# Patient Record
Sex: Female | Born: 1970 | Race: White | Hispanic: No | Marital: Single | Smoking: Never smoker
Health system: Southern US, Community
[De-identification: ages and names within clinical notes are randomized; demographics above are authoritative.]

## PROBLEM LIST (undated history)

## (undated) DIAGNOSIS — E7800 Pure hypercholesterolemia, unspecified: Secondary | ICD-10-CM

## (undated) DIAGNOSIS — I10 Essential (primary) hypertension: Secondary | ICD-10-CM

## (undated) DIAGNOSIS — E119 Type 2 diabetes mellitus without complications: Secondary | ICD-10-CM

## (undated) HISTORY — PX: BUNIONECTOMY: SHX129

## (undated) HISTORY — PX: VARICOSE VEIN SURGERY: SHX832

---

## 2017-09-24 ENCOUNTER — Emergency Department (HOSPITAL_COMMUNITY)
Admission: EM | Admit: 2017-09-24 | Discharge: 2017-09-24 | Disposition: A | Discharge status: Home / Self Care | Payer: BLUE CROSS/BLUE SHIELD | Attending: Emergency Medicine | Admitting: Emergency Medicine

## 2017-09-24 ENCOUNTER — Emergency Department (HOSPITAL_COMMUNITY): Payer: BLUE CROSS/BLUE SHIELD

## 2017-09-24 ENCOUNTER — Encounter (HOSPITAL_COMMUNITY): Payer: Self-pay

## 2017-09-24 DIAGNOSIS — F430 Acute stress reaction: Secondary | ICD-10-CM | POA: Diagnosis not present

## 2017-09-24 DIAGNOSIS — R419 Unspecified symptoms and signs involving cognitive functions and awareness: Secondary | ICD-10-CM | POA: Diagnosis not present

## 2017-09-24 DIAGNOSIS — Z7984 Long term (current) use of oral hypoglycemic drugs: Secondary | ICD-10-CM | POA: Insufficient documentation

## 2017-09-24 DIAGNOSIS — R0789 Other chest pain: Secondary | ICD-10-CM | POA: Diagnosis not present

## 2017-09-24 DIAGNOSIS — I10 Essential (primary) hypertension: Secondary | ICD-10-CM

## 2017-09-24 DIAGNOSIS — F419 Anxiety disorder, unspecified: Secondary | ICD-10-CM

## 2017-09-24 DIAGNOSIS — Z79899 Other long term (current) drug therapy: Secondary | ICD-10-CM | POA: Diagnosis not present

## 2017-09-24 DIAGNOSIS — R202 Paresthesia of skin: Secondary | ICD-10-CM | POA: Diagnosis present

## 2017-09-24 DIAGNOSIS — E119 Type 2 diabetes mellitus without complications: Secondary | ICD-10-CM | POA: Insufficient documentation

## 2017-09-24 DIAGNOSIS — F439 Reaction to severe stress, unspecified: Secondary | ICD-10-CM

## 2017-09-24 DIAGNOSIS — R079 Chest pain, unspecified: Secondary | ICD-10-CM

## 2017-09-24 HISTORY — DX: Type 2 diabetes mellitus without complications: E11.9

## 2017-09-24 HISTORY — DX: Essential (primary) hypertension: I10

## 2017-09-24 LAB — I-STAT BETA HCG BLOOD, ED (MC, WL, AP ONLY): I-stat hCG, quantitative: <5 m[IU]/mL (ref <5)

## 2017-09-24 LAB — BASIC METABOLIC PANEL
ANION GAP: 10 (ref 5–15)
BUN: 14 mg/dL (ref 6–20)
CHLORIDE: 107 mmol/L (ref 101–111)
CO2: 21 mmol/L — AB (ref 22–32)
Calcium: 8.6 mg/dL — ABNORMAL LOW (ref 8.9–10.3)
Creatinine, Ser: 0.53 mg/dL (ref 0.44–1.00)
GFR calc non Af Amer: >60 mL/min (ref >60)
Glucose, Bld: 147 mg/dL — ABNORMAL HIGH (ref 65–99)
POTASSIUM: 3.8 mmol/L (ref 3.5–5.1)
Sodium: 138 mmol/L (ref 135–145)

## 2017-09-24 LAB — CBC
HEMATOCRIT: 39 % (ref 36.0–46.0)
Hemoglobin: 12.7 g/dL (ref 12.0–15.0)
MCH: 29.5 pg (ref 26.0–34.0)
MCHC: 32.6 g/dL (ref 30.0–36.0)
MCV: 90.7 fL (ref 78.0–100.0)
Platelets: 253 10*3/uL (ref 150–400)
RBC: 4.3 MIL/uL (ref 3.87–5.11)
RDW: 12.8 % (ref 11.5–15.5)
WBC: 7.5 10*3/uL (ref 4.0–10.5)

## 2017-09-24 LAB — I-STAT TROPONIN, ED: TROPONIN I, POC: 0 ng/mL (ref 0.00–0.08)

## 2017-09-24 MED ORDER — LISINOPRIL 5 MG PO TABS: 10.0000 mg | ORAL_TABLET | Freq: Every evening | ORAL | 1 refills | Status: DC

## 2017-09-24 MED ORDER — LORAZEPAM 0.5 MG PO TABS: 1.0000 mg | ORAL_TABLET | Freq: Four times a day (QID) | ORAL | 0 refills | Status: DC | PRN

## 2017-09-24 NOTE — ED Triage Notes (Signed)
Pt complains of pressure like chest pain for two days, she also complains that her right arm and hand is numb and her lips are numb

## 2017-09-24 NOTE — ED Notes (Signed)
Bed: WTR5 Expected date:  Expected time:  Means of arrival:  Comments: 

## 2017-09-24 NOTE — Discharge Instructions (Addendum)
Your blood pressure is elevated today, and may explain some of your discomfort.  We are increasing your blood pressure medicine.  Make sure that you continue to watch your salt intake and drink plenty of water to keep your blood pressure better.  Work on stress management to improve your discomfort.  We are providing a short-term prescription for help with anxiety, but it is better to not take this if you can avoid it.  Consider seeing a counselor, or therapist to help with stress management.  Follow-up with your doctor for further care and treatment as soon as possible.

## 2017-09-24 NOTE — ED Provider Notes (Signed)
[facility]-EMERGENCY DEPT Provider Note   CSN: [id number] Arrival date & time: 09/24/17  0458     History   Chief Complaint No chief complaint on file.   HPI Jody Ball is a 47 y.o. female.  HPI   She presents for evaluation of chest discomfort associated with tingling in the face and both hands.  She also has a numb sensation around her lips.  Sometimes when she takes a deep breath she has worsening of her chest discomfort.  She is taking her regular medicines as directed. Patient and husband who is with her reports that she is having lots of stress."  Patient is currently employed.  She states she is taking her medicines regularly.  There are no other known modifying factors  Past Medical History:  Diagnosis Date  . Diabetes mellitus without complication (HCC)   . Hypertension     There are no active problems to display for this patient.   History reviewed. No pertinent surgical history.   OB History   None      Home Medications    Prior to Admission medications   Medication Sig Start Date End Date Taking? Authorizing Provider  atorvastatin (LIPITOR) 20 MG tablet Take 20 mg by mouth daily. 09/07/17  Yes [provider]  fluticasone (FLONASE) 50 MCG/ACT nasal spray Place 2 sprays into both nostrils 2 (two) times daily as needed for allergies or rhinitis.  09/10/17  Yes [provider]  JARDIANCE 10 MG TABS tablet Take 10 mg by mouth daily. 09/10/17  Yes [provider]  metFORMIN (GLUCOPHAGE-XR) 500 MG 24 hr tablet Take 500 mg by mouth 2 (two) times daily. 09/20/17  Yes [provider]  SPRINTEC 28 0.25-35 MG-MCG tablet Take 1 tablet by mouth daily. 09/10/17  Yes [provider]  lisinopril (PRINIVIL,ZESTRIL) 5 MG tablet Take 2 tablets (10 mg total) by mouth every evening. 09/24/17   Mancel Bale, MD  LORazepam (ATIVAN) 0.5 MG tablet Take 2 tablets (1 mg total) by mouth every 6 (six) hours as needed for  anxiety. 09/24/17   Mancel Bale, MD    Family History History reviewed. No pertinent family history.  Social History Social History   Tobacco Use  . Smoking status: Never Smoker  . Smokeless tobacco: Never Used  Substance Use Topics  . Alcohol use: Never    Frequency: Never  . Drug use: Never     Allergies   Patient has no known allergies.   Review of Systems Review of Systems  All other systems reviewed and are negative.    Physical Exam Updated Vital Signs BP (!) 157/94 (BP Location: Left Arm)   Pulse 70   Temp 97.6 F (36.4 C) (Oral)   Resp 15   LMP 09/09/2017   SpO2 100%   Physical Exam  Constitutional: She is oriented to person, place, and time. She appears well-developed and well-nourished. No distress.  HENT:  Head: Normocephalic and atraumatic.  Eyes: Pupils are equal, round, and reactive to light. Conjunctivae and EOM are normal.  Neck: Normal range of motion and phonation normal. Neck supple.  Cardiovascular: Normal rate and regular rhythm.  Pulmonary/Chest: Effort normal and breath sounds normal. No respiratory distress. She exhibits no tenderness.  Abdominal: Soft. She exhibits no distension. There is no tenderness. There is no guarding.  Musculoskeletal: Normal range of motion.  Neurological: She is alert and oriented to person, place, and time. She exhibits normal muscle tone.  Skin: Skin is warm  and dry.  Psychiatric: Her behavior is normal. Judgment and thought content normal.  Mildly anxious  Nursing note and vitals reviewed.    ED Treatments / Results  Labs (all labs ordered are listed, but only abnormal results are displayed) Labs Reviewed  BASIC METABOLIC PANEL - Abnormal; Notable for the following components:      Result Value   CO2 21 (*)    Glucose, Bld 147 (*)    Calcium 8.6 (*)    All other components within normal limits  CBC  I-STAT TROPONIN, ED  I-STAT BETA HCG BLOOD, ED ([facility], [facility], [facility] ONLY)    EKG EKG  Interpretation  Date/Time:  Tuesday Sep 24 2017 05:05:22 EDT Ventricular Rate:  90 PR Interval:    QRS Duration: 88 QT Interval:  381 QTC Calculation: 467 R Axis:   91 Text Interpretation:  Sinus rhythm Right atrial enlargement Borderline right axis deviation Baseline wander in lead(s) V2 V6 No previous tracing Confirmed by Gilda Crease [phone]) on 09/24/2017 5:08:20 AM   Radiology Dg Chest 2 View  Result Date: 09/24/2017 CLINICAL DATA:  46 year old female with chest pain. EXAM: CHEST - 2 VIEW COMPARISON:  None. FINDINGS: The heart size and mediastinal contours are within normal limits. Both lungs are clear. The visualized skeletal structures are unremarkable. IMPRESSION: No active cardiopulmonary disease. Electronically Signed   By: Elgie Collard M.D.   On: 09/24/2017 06:36    Procedures Procedures (including critical care time)  Medications Ordered in ED Medications - No data to display   Initial Impression / Assessment and Plan / ED Course  I have reviewed the triage vital signs and the nursing notes.  Pertinent labs & imaging results that were available during my care of the patient were reviewed by me and considered in my medical decision making (see chart for details).  Clinical Course as of Sep 25 830  Tue Sep 24, 2017  0759 I-Stat beta hCG blood, ED [EW]  [id number] Normal  I-Stat beta hCG blood, ED [EW]  0803 I-stat troponin, ED [EW]  0803 normal   [EW]  0803 Normal except CO2 low, calcium low, and glucose high  Basic metabolic panel(!) [EW]  0804 Normal  CBC [EW]  0804 Elevated  BP(!): 171/98 [EW]    Clinical Course User Index [EW] Mancel Bale, MD     Patient Vitals for the past 24 hrs:  BP Temp Temp src Pulse Resp SpO2  09/24/17 0829 (!) 157/94 - - 70 15 100 %  09/24/17 0758 (!) 157/97 97.6 F (36.4 C) Oral 77 10 100 %  09/24/17 0506 (!) 171/98 98.1 F (36.7 C) Oral 88 20 100 %    8:30 AM Reevaluation with update and discussion. After  initial assessment and treatment, an updated evaluation reveals no change in clinical status.  Findings discussed with patient and husband, all questions answered. Mancel Bale   Medical decision making-nonspecific short-term chest pain with signs and symptoms of anxiety and mild hypertension.  Hypertensive urgency, ACS, PE or pneumonia.  Nursing Notes Reviewed/ Care Coordinated Applicable Imaging Reviewed Interpretation of Laboratory Data incorporated into ED treatment  The patient appears reasonably screened and/or stabilized for discharge and I doubt any other medical condition or other [facility] requiring further screening, evaluation, or treatment in the ED at this time prior to discharge.  Plan: Home Medications-increase lisinopril to 10 mg nightly.; Home Treatments-rest, fluids, stress management; return here if the recommended treatment, does not improve the symptoms; Recommended follow up-PCP checkup 1  week and as needed         Final Clinical Impressions(s) / ED Diagnoses   Final diagnoses:  Hypertension, unspecified type  Anxiety  Chest pain, unspecified type  Stress    ED Discharge Orders        Ordered    lisinopril (PRINIVIL,ZESTRIL) 5 MG tablet  Every evening     09/24/17 0821    LORazepam (ATIVAN) 0.5 MG tablet  Every 6 hours PRN     09/24/17 2440       Mancel Bale, MD 09/24/17 [phone]

## 2021-11-03 ENCOUNTER — Emergency Department (HOSPITAL_COMMUNITY): Payer: BC Managed Care – PPO

## 2021-11-03 ENCOUNTER — Encounter (HOSPITAL_COMMUNITY): Payer: Self-pay

## 2021-11-03 ENCOUNTER — Other Ambulatory Visit: Payer: Self-pay

## 2021-11-03 ENCOUNTER — Inpatient Hospital Stay (HOSPITAL_COMMUNITY)
Admission: EM | Admit: 2021-11-03 | Discharge: 2021-11-07 | DRG: 871 | Disposition: A | Discharge status: Home / Self Care | Payer: BC Managed Care – PPO | Attending: Internal Medicine | Admitting: Internal Medicine

## 2021-11-03 DIAGNOSIS — D649 Anemia, unspecified: Secondary | ICD-10-CM | POA: Diagnosis present

## 2021-11-03 DIAGNOSIS — E1165 Type 2 diabetes mellitus with hyperglycemia: Secondary | ICD-10-CM | POA: Diagnosis present

## 2021-11-03 DIAGNOSIS — E872 Acidosis, unspecified: Secondary | ICD-10-CM | POA: Diagnosis not present

## 2021-11-03 DIAGNOSIS — E1169 Type 2 diabetes mellitus with other specified complication: Secondary | ICD-10-CM | POA: Diagnosis present

## 2021-11-03 DIAGNOSIS — R6521 Severe sepsis with septic shock: Secondary | ICD-10-CM | POA: Diagnosis present

## 2021-11-03 DIAGNOSIS — K5900 Constipation, unspecified: Secondary | ICD-10-CM | POA: Diagnosis present

## 2021-11-03 DIAGNOSIS — E1110 Type 2 diabetes mellitus with ketoacidosis without coma: Secondary | ICD-10-CM | POA: Diagnosis present

## 2021-11-03 DIAGNOSIS — E871 Hypo-osmolality and hyponatremia: Secondary | ICD-10-CM | POA: Diagnosis present

## 2021-11-03 DIAGNOSIS — N179 Acute kidney failure, unspecified: Secondary | ICD-10-CM | POA: Diagnosis not present

## 2021-11-03 DIAGNOSIS — N12 Tubulo-interstitial nephritis, not specified as acute or chronic: Secondary | ICD-10-CM | POA: Diagnosis present

## 2021-11-03 DIAGNOSIS — A419 Sepsis, unspecified organism: Secondary | ICD-10-CM | POA: Diagnosis present

## 2021-11-03 DIAGNOSIS — E860 Dehydration: Secondary | ICD-10-CM | POA: Diagnosis present

## 2021-11-03 DIAGNOSIS — R319 Hematuria, unspecified: Secondary | ICD-10-CM | POA: Diagnosis present

## 2021-11-03 DIAGNOSIS — A4181 Sepsis due to Enterococcus: Secondary | ICD-10-CM | POA: Diagnosis present

## 2021-11-03 DIAGNOSIS — Z7984 Long term (current) use of oral hypoglycemic drugs: Secondary | ICD-10-CM | POA: Diagnosis not present

## 2021-11-03 DIAGNOSIS — I10 Essential (primary) hypertension: Secondary | ICD-10-CM | POA: Diagnosis present

## 2021-11-03 DIAGNOSIS — E782 Mixed hyperlipidemia: Secondary | ICD-10-CM | POA: Diagnosis present

## 2021-11-03 DIAGNOSIS — I21A1 Myocardial infarction type 2: Secondary | ICD-10-CM | POA: Diagnosis not present

## 2021-11-03 DIAGNOSIS — Z800 Family history of malignant neoplasm of digestive organs: Secondary | ICD-10-CM | POA: Diagnosis not present

## 2021-11-03 DIAGNOSIS — N170 Acute kidney failure with tubular necrosis: Secondary | ICD-10-CM | POA: Diagnosis present

## 2021-11-03 DIAGNOSIS — Z79899 Other long term (current) drug therapy: Secondary | ICD-10-CM | POA: Diagnosis not present

## 2021-11-03 DIAGNOSIS — N10 Acute tubulo-interstitial nephritis: Secondary | ICD-10-CM | POA: Diagnosis present

## 2021-11-03 DIAGNOSIS — E876 Hypokalemia: Secondary | ICD-10-CM | POA: Diagnosis not present

## 2021-11-03 DIAGNOSIS — N390 Urinary tract infection, site not specified: Secondary | ICD-10-CM | POA: Diagnosis not present

## 2021-11-03 HISTORY — DX: Pure hypercholesterolemia, unspecified: E78.00

## 2021-11-03 LAB — I-STAT BETA HCG BLOOD, ED (MC, WL, AP ONLY): I-stat hCG, quantitative: <5 m[IU]/mL (ref <5)

## 2021-11-03 LAB — URINALYSIS, ROUTINE W REFLEX MICROSCOPIC
Bilirubin Urine: NEGATIVE
Glucose, UA: 50 mg/dL — AB
Ketones, ur: 20 mg/dL — AB
Nitrite: NEGATIVE
Protein, ur: 100 mg/dL — AB
Specific Gravity, Urine: 1.023 (ref 1.005–1.030)
pH: 5 (ref 5.0–8.0)

## 2021-11-03 LAB — CBG MONITORING, ED: Glucose-Capillary: 317 mg/dL — ABNORMAL HIGH (ref 70–99)

## 2021-11-03 LAB — CBC
HCT: 37.9 % (ref 36.0–46.0)
Hemoglobin: 12.7 g/dL (ref 12.0–15.0)
MCH: 32.5 pg (ref 26.0–34.0)
MCHC: 33.5 g/dL (ref 30.0–36.0)
MCV: 96.9 fL (ref 80.0–100.0)
Platelets: 238 10*3/uL (ref 150–400)
RBC: 3.91 MIL/uL (ref 3.87–5.11)
RDW: 12.6 % (ref 11.5–15.5)
WBC: 9.6 10*3/uL (ref 4.0–10.5)
nRBC: 0 % (ref 0.0–0.2)

## 2021-11-03 LAB — BLOOD GAS, VENOUS
Acid-base deficit: 25 mmol/L — ABNORMAL HIGH (ref 0.0–2.0)
Bicarbonate: 4 mmol/L — ABNORMAL LOW (ref 20.0–28.0)
O2 Saturation: 88.4 %
Patient temperature: 36.6
pCO2, Ven: <18 mmHg — CL (ref 44–60)
pH, Ven: 7.03 — CL (ref 7.25–7.43)
pO2, Ven: 62 mmHg — ABNORMAL HIGH (ref 32–45)

## 2021-11-03 LAB — GLUCOSE, CAPILLARY: Glucose-Capillary: 328 mg/dL — ABNORMAL HIGH (ref 70–99)

## 2021-11-03 LAB — COMPREHENSIVE METABOLIC PANEL
ALT: 25 U/L (ref 0–44)
AST: 30 U/L (ref 15–41)
Albumin: 3 g/dL — ABNORMAL LOW (ref 3.5–5.0)
Alkaline Phosphatase: 66 U/L (ref 38–126)
Anion gap: >20 — ABNORMAL HIGH (ref 5–15)
BUN: 72 mg/dL — ABNORMAL HIGH (ref 6–20)
CO2: 11 mmol/L — ABNORMAL LOW (ref 22–32)
Calcium: 8.8 mg/dL — ABNORMAL LOW (ref 8.9–10.3)
Chloride: 90 mmol/L — ABNORMAL LOW (ref 98–111)
Creatinine, Ser: 4.82 mg/dL — ABNORMAL HIGH (ref 0.44–1.00)
GFR, Estimated: 10 mL/min — ABNORMAL LOW (ref >60)
Glucose, Bld: 321 mg/dL — ABNORMAL HIGH (ref 70–99)
Potassium: 4.1 mmol/L (ref 3.5–5.1)
Sodium: 128 mmol/L — ABNORMAL LOW (ref 135–145)
Total Bilirubin: 0.5 mg/dL (ref 0.3–1.2)
Total Protein: 7 g/dL (ref 6.5–8.1)

## 2021-11-03 LAB — LACTIC ACID, PLASMA
Lactic Acid, Venous: 5.2 mmol/L (ref 0.5–1.9)
Lactic Acid, Venous: 5.4 mmol/L (ref 0.5–1.9)
Lactic Acid, Venous: 7.1 mmol/L (ref 0.5–1.9)

## 2021-11-03 LAB — APTT: aPTT: 32 seconds (ref 24–36)

## 2021-11-03 LAB — BETA-HYDROXYBUTYRIC ACID: Beta-Hydroxybutyric Acid: >8 mmol/L — ABNORMAL HIGH (ref 0.05–0.27)

## 2021-11-03 LAB — PROTIME-INR
INR: 0.9 (ref 0.8–1.2)
Prothrombin Time: 12.5 seconds (ref 11.4–15.2)

## 2021-11-03 LAB — LIPASE, BLOOD: Lipase: 26 U/L (ref 11–51)

## 2021-11-03 MED ORDER — BUSPIRONE HCL 5 MG PO TABS
5.0000 mg | ORAL_TABLET | Freq: Two times a day (BID) | ORAL | Status: DC | PRN
  Administered 2021-11-03: 5 mg via ORAL
  Filled 2021-11-03: qty 1

## 2021-11-03 MED ORDER — MORPHINE SULFATE (PF) 4 MG/ML IV SOLN: 4.0000 mg | INTRAVENOUS | Status: DC | PRN

## 2021-11-03 MED ORDER — ATORVASTATIN CALCIUM 10 MG PO TABS: 20.0000 mg | ORAL_TABLET | Freq: Every day | ORAL | Status: DC

## 2021-11-03 MED ORDER — ENSURE ENLIVE PO LIQD
237.0000 mL | Freq: Two times a day (BID) | ORAL | Status: DC
  Administered 2021-11-04 – 2021-11-05 (×2): 237 mL via ORAL

## 2021-11-03 MED ORDER — ACETAMINOPHEN 325 MG PO TABS
650.0000 mg | ORAL_TABLET | Freq: Four times a day (QID) | ORAL | Status: DC | PRN
  Administered 2021-11-05 – 2021-11-07 (×4): 650 mg via ORAL
  Filled 2021-11-03 (×4): qty 2

## 2021-11-03 MED ORDER — LACTATED RINGERS IV BOLUS
1000.0000 mL | Freq: Once | INTRAVENOUS | Status: AC
  Administered 2021-11-03: 1000 mL via INTRAVENOUS

## 2021-11-03 MED ORDER — ACETAMINOPHEN 650 MG RE SUPP: 650.0000 mg | Freq: Four times a day (QID) | RECTAL | Status: DC | PRN

## 2021-11-03 MED ORDER — LACTATED RINGERS IV BOLUS
1000.0000 mL | Freq: Once | INTRAVENOUS | Status: AC
Start: 2021-11-03 — End: 2021-11-04
  Administered 2021-11-03: 1000 mL via INTRAVENOUS

## 2021-11-03 MED ORDER — DEXTROSE 50 % IV SOLN: 0.0000 mL | INTRAVENOUS | Status: DC | PRN

## 2021-11-03 MED ORDER — HEPARIN SODIUM (PORCINE) 5000 UNIT/ML IJ SOLN
5000.0000 [IU] | Freq: Three times a day (TID) | INTRAMUSCULAR | Status: DC
  Administered 2021-11-03 – 2021-11-04 (×2): 5000 [IU] via SUBCUTANEOUS
  Filled 2021-11-03 (×3): qty 1

## 2021-11-03 MED ORDER — SODIUM CHLORIDE 0.9% FLUSH
3.0000 mL | Freq: Two times a day (BID) | INTRAVENOUS | Status: DC
  Administered 2021-11-03 – 2021-11-07 (×8): 3 mL via INTRAVENOUS

## 2021-11-03 MED ORDER — SODIUM CHLORIDE 0.9 % IV SOLN
2.0000 g | INTRAVENOUS | Status: DC
  Administered 2021-11-03: 2 g via INTRAVENOUS
  Filled 2021-11-03: qty 20

## 2021-11-03 MED ORDER — LACTATED RINGERS IV SOLN: INTRAVENOUS | Status: DC

## 2021-11-03 MED ORDER — ONDANSETRON HCL 4 MG/2ML IJ SOLN: 4.0000 mg | Freq: Four times a day (QID) | INTRAMUSCULAR | Status: DC | PRN

## 2021-11-03 MED ORDER — MORPHINE SULFATE (PF) 2 MG/ML IV SOLN
2.0000 mg | Freq: Once | INTRAVENOUS | Status: AC
  Administered 2021-11-03: 2 mg via INTRAVENOUS
  Filled 2021-11-03: qty 1

## 2021-11-03 MED ORDER — POLYETHYLENE GLYCOL 3350 17 G PO PACK: 17.0000 g | PACK | Freq: Every day | ORAL | Status: DC | PRN

## 2021-11-03 MED ORDER — HYDROMORPHONE HCL 1 MG/ML IJ SOLN
1.0000 mg | INTRAMUSCULAR | Status: DC | PRN
  Administered 2021-11-03: 1 mg via INTRAVENOUS
  Filled 2021-11-03: qty 1

## 2021-11-03 MED ORDER — FENTANYL CITRATE PF 50 MCG/ML IJ SOSY
25.0000 ug | PREFILLED_SYRINGE | Freq: Once | INTRAMUSCULAR | Status: AC
  Administered 2021-11-03: 25 ug via INTRAVENOUS
  Filled 2021-11-03: qty 1

## 2021-11-03 MED ORDER — MORPHINE SULFATE (PF) 2 MG/ML IV SOLN
2.0000 mg | INTRAVENOUS | Status: DC | PRN
  Administered 2021-11-03: 2 mg via INTRAVENOUS
  Filled 2021-11-03: qty 1

## 2021-11-03 MED ORDER — HYDROMORPHONE HCL 1 MG/ML IJ SOLN: 0.5000 mg | INTRAMUSCULAR | Status: DC | PRN

## 2021-11-03 MED ORDER — HYDRALAZINE HCL 20 MG/ML IJ SOLN: 10.0000 mg | Freq: Four times a day (QID) | INTRAMUSCULAR | Status: DC | PRN

## 2021-11-03 MED ORDER — OXYCODONE-ACETAMINOPHEN 5-325 MG PO TABS: 1.0000 | ORAL_TABLET | ORAL | Status: DC | PRN

## 2021-11-03 MED ORDER — DEXTROSE IN LACTATED RINGERS 5 % IV SOLN: INTRAVENOUS | Status: DC

## 2021-11-03 MED ORDER — ONDANSETRON HCL 4 MG PO TABS: 4.0000 mg | ORAL_TABLET | Freq: Four times a day (QID) | ORAL | Status: DC | PRN

## 2021-11-03 MED ORDER — INSULIN REGULAR(HUMAN) IN NACL 100-0.9 UT/100ML-% IV SOLN
INTRAVENOUS | Status: DC
  Administered 2021-11-04: 4 [IU]/h via INTRAVENOUS
  Filled 2021-11-03: qty 100

## 2021-11-03 MED ORDER — PIPERACILLIN-TAZOBACTAM IN DEX 2-0.25 GM/50ML IV SOLN
2.2500 g | Freq: Three times a day (TID) | INTRAVENOUS | Status: DC
  Administered 2021-11-03 – 2021-11-05 (×5): 2.25 g via INTRAVENOUS
  Filled 2021-11-03 (×6): qty 50

## 2021-11-03 MED ORDER — INSULIN ASPART 100 UNIT/ML IJ SOLN
0.0000 [IU] | Freq: Three times a day (TID) | INTRAMUSCULAR | Status: DC
  Administered 2021-11-03: 11 [IU] via SUBCUTANEOUS

## 2021-11-03 NOTE — Sepsis Progress Note (Signed)
Following per sepsis protocol   

## 2021-11-03 NOTE — Assessment & Plan Note (Signed)
.   Holding statin therapy for now while patient is n.p.o.

## 2021-11-03 NOTE — Assessment & Plan Note (Signed)
   Modest hyponatremia likely secondary to volume depletion  Hydrating patient with intravenous isotonic fluids  Monitoring sodium levels with serial chemistries

## 2021-11-03 NOTE — Assessment & Plan Note (Signed)
.   Substantial hyperglycemia in the setting of sepsis . Patient exhibiting significant anion gap however this is most likely due to lactic acidosis.  Will obtain beta-hydroxybutyrate and urinalysis to evaluate for evidence of DKA albeit unlikely . Patient been placed on Accu-Cheks before every meal and nightly with sliding scale insulin . Holding home regimen of oral hypoglycemics . Hemoglobin A1C ordered . Diabetic Diet

## 2021-11-03 NOTE — Progress Notes (Signed)
Pt arrived to room via stretcher from ED. Pt oriented to room/floor. Demonstrated understanding. Pt placed on telemetry. Continue LR bolus. Denies any further needs at this time.

## 2021-11-03 NOTE — Sepsis Progress Note (Signed)
Notified MD requesting orders for serial lactic acids due to elevation of the repeat lab results.

## 2021-11-03 NOTE — Progress Notes (Signed)
Pharmacy Antibiotic Note  Tranisha Tissue is a 51 y.o. female admitted on 11/03/2021 with sepsis, AKI.  Pharmacy has been consulted for Zosyn dosing.  Plan: Zosyn 2.25g IV q8h  Follow up renal function, culture results, and clinical course.  Height: 5\' 5"  (165.1 cm) Weight: 66.7 kg (147 lb) IBW/kg (Calculated) : 57  Temp (24hrs), Avg:98.2 F (36.8 C), Min:97.8 F (36.6 C), Max:98.7 F (37.1 C)  Recent Labs  Lab 11/03/21 1631 11/03/21 1651 11/03/21 1800  WBC 9.6  --   --   CREATININE 4.82*  --   --   LATICACIDVEN  --  5.2* 5.4*    Estimated Creatinine Clearance: 12.3 mL/min (A) (by C-G formula based on SCr of 4.82 mg/dL (H)).    No Known Allergies  Antimicrobials this admission: PTA 6/15 Cipro >> 6/16 6/16 Ceftriaxone x1 6/16 Zosyn >>  Dose adjustments this admission:   Microbiology results: 6/16 BCx:  6/16 UCx:   Thank you for allowing pharmacy to be a part of this patient's care.  7/16 PharmD, BCPS Clinical Pharmacist [facility] main pharmacy [phone] 11/03/2021 9:08 PM

## 2021-11-03 NOTE — ED Triage Notes (Addendum)
Patient reports that she saw her PCP yesterday- Patient reports a UTI and received antibiotics, abnormal labs, CT scan was done, patient has not had a BM in 7 days. Patient also reports hypotension and fever.  [id number]

## 2021-11-03 NOTE — Assessment & Plan Note (Addendum)
.   Patient presenting with severe right-sided pyelonephritis in the setting of multiple SIRS criteria and evidence of organ dysfunction with acute kidney injury all concerning for sepsis with organ dysfunction . Throughout the evening despite nearly 5 L of isotonic fluids being administered patient is becoming progressively more hypotensive suggestive of septic shock . Likely gram-negative organism as the cause . Patient has been clinically worsening over the past 48 hours despite receiving a recent dose of intramuscular ceftriaxone. . Due to severity of illness patient was initially placed on Zosyn but through the evening the patient has clinically decompensated and therefore we are additionally adding vancomycin. Marland Kitchen Hydrating patient aggressively with intravenous isotonic fluids . Due to significant hemodynamic compromise despite aggressive fluids we will additionally add Levophed and ask our PCCM partners to come evaluate patient to potentially go to their service.   . Patient will likely need central line placement. . Blood cultures and urine cultures obtained, antibiotic therapy will be deescalated based on these results . Obtaining serial lactic acid levels . Close clinical monitoring as patient is at high risk of rapid clinical decompensation

## 2021-11-03 NOTE — ED Notes (Signed)
ED TO INPATIENT HANDOFF REPORT  Name/Age/Gender Jody Ball 51 y.o. female  Code Status   Home/SNF/Other Home  Chief Complaint Sepsis due to gram-negative UTI ([place]) [A41.50, N39.0]  Level of Care/Admitting Diagnosis ED Disposition     ED Disposition  Admit   Condition  --   Comment  Hospital Area: [place] [[id number]]  Level of Care: Progressive [102]  Admit to Progressive based on following criteria: MULTISYSTEM THREATS such as stable sepsis, metabolic/electrolyte imbalance with or without encephalopathy that is responding to early treatment.  May admit patient to [facility] or [facility] if equivalent level of care is available:: No  Covid Evaluation: Asymptomatic - no recent exposure (last 10 days) testing not required  Diagnosis: Sepsis due to gram-negative UTI [facility]) [id number]  Admitting Physician: Vernelle Emerald [id number]  Attending Physician: Vernelle Emerald [id number]  Estimated length of stay: 3 - 4 days  Certification:: I certify this patient will need inpatient services for at least 2 midnights          Medical History Past Medical History:  Diagnosis Date   Diabetes mellitus without complication ([place])    High cholesterol    Hypertension     Allergies No Known Allergies  IV Location/Drains/Wounds Patient Lines/Drains/Airways Status     Active Line/Drains/Airways     Name Placement date Placement time Site Days   Peripheral IV 11/03/21 20 G Left Antecubital 11/03/21  1751  Antecubital  less than 1   Peripheral IV 11/03/21 22 G Anterior;Right Hand 11/03/21  1950  Hand  less than 1   External Urinary Catheter 11/03/21  1854  --  less than 1            Labs/Imaging Results for orders placed or performed during the hospital encounter of 11/03/21 (from the past 48 hour(s))  CBG monitoring, ED     Status: Abnormal   Collection Time: 11/03/21  3:50 PM  Result Value Ref Range   Glucose-Capillary 317 (H) 70 - 99 mg/dL     Comment: Glucose reference range applies only to samples taken after fasting for at least 8 hours.  Lipase, blood     Status: None   Collection Time: 11/03/21  4:31 PM  Result Value Ref Range   Lipase 26 11 - 51 U/L    Comment: Performed at [facility], [place] [address], [place], [place] [address]  Comprehensive metabolic panel     Status: Abnormal   Collection Time: 11/03/21  4:31 PM  Result Value Ref Range   Sodium 128 (L) 135 - 145 mmol/L    Comment: REPEATED TO VERIFY   Potassium 4.1 3.5 - 5.1 mmol/L   Chloride 90 (L) 98 - 111 mmol/L    Comment: REPEATED TO VERIFY   CO2 11 (L) 22 - 32 mmol/L    Comment: REPEATED TO VERIFY   Glucose, Bld 321 (H) 70 - 99 mg/dL    Comment: Glucose reference range applies only to samples taken after fasting for at least 8 hours.   BUN 72 (H) 6 - 20 mg/dL   Creatinine, Ser 4.82 (H) 0.44 - 1.00 mg/dL   Calcium 8.8 (L) 8.9 - 10.3 mg/dL   Total Protein 7.0 6.5 - 8.1 g/dL   Albumin 3.0 (L) 3.5 - 5.0 g/dL   AST 30 15 - 41 U/L   ALT 25 0 - 44 U/L   Alkaline Phosphatase 66 38 - 126 U/L   Total Bilirubin 0.5 0.3 -  1.2 mg/dL   GFR, Estimated 10 (L) >60 mL/min    Comment: (NOTE) Calculated using the CKD-EPI Creatinine Equation (2021)    Anion gap >20 (H) 5 - 15    Comment: Performed at [facility], [address], [place], [place] [address]  CBC     Status: None   Collection Time: 11/03/21  4:31 PM  Result Value Ref Range   WBC 9.6 4.0 - 10.5 K/uL   RBC 3.91 3.87 - 5.11 MIL/uL   Hemoglobin 12.7 12.0 - 15.0 g/dL   HCT 84.6 96.2 - 95.2 %   MCV 96.9 80.0 - 100.0 fL   MCH 32.5 26.0 - 34.0 pg   MCHC 33.5 30.0 - 36.0 g/dL   RDW 84.1 32.4 - 40.1 %   Platelets 238 150 - 400 K/uL   nRBC 0.0 0.0 - 0.2 %    Comment: Performed at [facility], [address] [place], [place] [address]  I-Stat beta hCG blood, ED     Status: None   Collection Time: 11/03/21  4:35 PM  Result Value Ref Range    I-stat hCG, quantitative <5.0 <5 mIU/mL   Comment 3            Comment:   GEST. AGE      CONC.  (mIU/mL)   <=1 WEEK        5 - 50     2 WEEKS       50 - 500     3 WEEKS       100 - 10,000     4 WEEKS     1,000 - 30,000        FEMALE AND NON-PREGNANT FEMALE:     LESS THAN 5 mIU/mL   Lactic acid, plasma     Status: Abnormal   Collection Time: 11/03/21  4:51 PM  Result Value Ref Range   Lactic Acid, Venous 5.2 (HH) 0.5 - 1.9 mmol/L    Comment: CRITICAL RESULT CALLED TO, READ BACK BY AND VERIFIED WITH: CORTEX,I @1740  ON 11/03/21 BY LUZOLOP Performed at [facility], [address] [place], [place]   Protime-INR     Status: None   Collection Time: 11/03/21  4:51 PM  Result Value Ref Range   Prothrombin Time 12.5 11.4 - 15.2 seconds   INR 0.9 0.8 - 1.2    Comment: (NOTE) INR goal varies based on device and disease states. Performed at [facility], [address] [place], [place]   APTT     Status: None   Collection Time: 11/03/21  4:51 PM  Result Value Ref Range   aPTT 32 24 - 36 seconds    Comment: Performed at [facility], [address], [place], [place]  Lactic acid, plasma     Status: Abnormal   Collection Time: 11/03/21  6:00 PM  Result Value Ref Range   Lactic Acid, Venous 5.4 (HH) 0.5 - 1.9 mmol/L    Comment: CRITICAL RESULT CALLED TO, READ BACK BY AND VERIFIED WITH: Mesha Schamberger,J @1833  ON 11/03/21 BY LUZOLOP Performed at [facility], [address], [place], [place]    CT ABDOMEN PELVIS WO CONTRAST  Result Date: 11/03/2021 CLINICAL DATA:  Urinary tract infection. Fever. Hypotension. Change in bowel habits. EXAM: CT ABDOMEN AND PELVIS WITHOUT CONTRAST TECHNIQUE: Multidetector CT imaging of the abdomen and pelvis was performed following the standard protocol without IV  contrast. RADIATION DOSE REDUCTION: This exam was performed according to the departmental  dose-optimization program which includes automated exposure control, adjustment of the mA and/or kV according to patient size and/or use of iterative reconstruction technique. COMPARISON:  None Available. FINDINGS: Lower chest: No acute findings. Hepatobiliary: No mass visualized on this unenhanced exam. Contrast material is noted within the gallbladder which is otherwise unremarkable in appearance. No evidence of biliary ductal dilatation. Pancreas: No mass or inflammatory process visualized on this unenhanced exam. Spleen:  Within normal limits in size. Adrenals/Urinary tract: Right renal swelling is seen with numerous areas of decreased parenchymal enhancement and mild perinephric soft tissue stranding. These findings are consistent with severe pyelonephritis. No renal abscess identified. No evidence of ureteral calculi or hydronephrosis. Unremarkable urinary bladder. Stomach/Bowel: No evidence of obstruction, inflammatory process, or abnormal fluid collections. Vascular/Lymphatic: No pathologically enlarged lymph nodes identified. No evidence of abdominal aortic aneurysm. Reproductive:  No mass or other significant abnormality. Other:  None. Musculoskeletal:  No suspicious bone lesions identified. IMPRESSION: Severe right pyelonephritis. No evidence of renal abscess, ureteral calculus, or hydronephrosis. Electronically Signed   By: Danae Orleans M.D.   On: 11/03/2021 18:54   DG Chest Port 1 View  Result Date: 11/03/2021 CLINICAL DATA:  Possible sepsis EXAM: PORTABLE CHEST 1 VIEW COMPARISON:  09/24/2017 FINDINGS: The heart size and mediastinal contours are within normal limits. Both lungs are clear. The visualized skeletal structures are unremarkable. IMPRESSION: No active disease. Electronically Signed   By: Ernie Avena M.D.   On: 11/03/2021 16:51    Pending Labs Unresulted Labs (From admission, onward)     Start     Ordered   11/03/21 1628  Blood Culture (routine x 2)  (Undifferentiated  presentation (screening labs and basic nursing orders))  BLOOD CULTURE X 2,   STAT      11/03/21 1628   11/03/21 1628  Urine Culture  (Undifferentiated presentation (screening labs and basic nursing orders))  ONCE - URGENT,   URGENT       Question Answer Comment  Indication Flank Pain   Release to patient Immediate      11/03/21 1628   11/03/21 1603  Urinalysis, Routine w reflex microscopic  Once,   URGENT        11/03/21 1603            Vitals/Pain Today's Vitals   11/03/21 1803 11/03/21 1852 11/03/21 1900 11/03/21 1930  BP: 129/71 120/65 123/71 (!) 105/58  Pulse: (!) 112 (!) 113 (!) 115 (!) 120  Resp: 16 20 19 20   Temp:      TempSrc:      SpO2: 99% 98% 100% 100%  Weight:      Height:      PainSc:        Isolation Precautions No active isolations  Medications Medications  lactated ringers infusion ( Intravenous New Bag/Given 11/03/21 1949)  cefTRIAXone (ROCEPHIN) 2 g in sodium chloride 0.9 % 100 mL IVPB (2 g Intravenous New Bag/Given 11/03/21 1936)  lactated ringers bolus 1,000 mL (0 mLs Intravenous Stopped 11/03/21 1950)  fentaNYL (SUBLIMAZE) injection 25 mcg (25 mcg Intravenous Given 11/03/21 1809)  lactated ringers bolus 1,000 mL (0 mLs Intravenous Stopped 11/03/21 1950)  fentaNYL (SUBLIMAZE) injection 25 mcg (25 mcg Intravenous Given 11/03/21 1940)  lactated ringers bolus 1,000 mL (1,000 mLs Intravenous New Bag/Given 11/03/21 1941)    Mobility walks with person assist

## 2021-11-03 NOTE — ED Provider Notes (Signed)
[facility]-EMERGENCY DEPT Provider Note   CSN: [id number] Arrival date & time: 11/03/21  1544     History Chief Complaint  Patient presents with   Abnormal Lab   Hyperglycemia    Jody Ball is a 51 y.o. female patient who presents to the emergency department with low blood pressure, intermittent fever, and diagnosed pyelonephritis on CT imaging.  Patient started feeling ill on Sunday.  Patient was seen evaluated by her PCP on Tuesday she had blood work and had a CT scan that showed pyelonephritis.  Patient was given ceftriaxone.  She was instructed to come to the emergency department after she got some blood work back and her white count was still elevated and patient was not feeling better after ceftriaxone.  Fever at home has been up to 102.  She been taking Tylenol which she took this morning.  Patient complaining of abdominal pain that radiates to the back.   Abnormal Lab Hyperglycemia      Home Medications Prior to Admission medications   Medication Sig Start Date End Date Taking? Authorizing Provider  atorvastatin (LIPITOR) 20 MG tablet Take 20 mg by mouth daily. 09/07/17   [provider]  fluticasone (FLONASE) 50 MCG/ACT nasal spray Place 2 sprays into both nostrils 2 (two) times daily as needed for allergies or rhinitis.  09/10/17   [provider]  JARDIANCE 10 MG TABS tablet Take 10 mg by mouth daily. 09/10/17   [provider]  lisinopril (PRINIVIL,ZESTRIL) 5 MG tablet Take 2 tablets (10 mg total) by mouth every evening. 09/24/17   Mancel Bale, MD  LORazepam (ATIVAN) 0.5 MG tablet Take 2 tablets (1 mg total) by mouth every 6 (six) hours as needed for anxiety. 09/24/17   Mancel Bale, MD  metFORMIN (GLUCOPHAGE-XR) 500 MG 24 hr tablet Take 500 mg by mouth 2 (two) times daily. 09/20/17   [provider]  SPRINTEC 28 0.25-35 MG-MCG tablet Take 1 tablet by mouth daily. 09/10/17   [provider]      Allergies     Patient has no known allergies.    Review of Systems   Review of Systems  All other systems reviewed and are negative.   Physical Exam Updated Vital Signs BP (!) 105/58   Pulse (!) 120   Temp 98.7 F (37.1 C) (Oral)   Resp 20   Ht 5\' 5"  (1.651 m)   Wt 66.7 kg   LMP 10/19/2021 (Approximate)   SpO2 100%   BMI 24.46 kg/m  Physical Exam Vitals and nursing note reviewed.  Constitutional:      General: She is not in acute distress.    Appearance: Normal appearance.  HENT:     Head: Normocephalic and atraumatic.  Eyes:     General:        Right eye: No discharge.        Left eye: No discharge.  Cardiovascular:     Rate and Rhythm: Tachycardia present.     Comments: S1/S2 are distinct without any evidence of murmur, rubs, or gallops.  Radial pulses are 2+ bilaterally.  Dorsalis pedis pulses are 2+ bilaterally.  No evidence of pedal edema. Pulmonary:     Comments: Clear to auscultation bilaterally.  Normal effort.  No respiratory distress.  No evidence of wheezes, rales, or rhonchi heard throughout. Abdominal:     General: Abdomen is flat. Bowel sounds are normal. There is no distension.     Tenderness: There is no abdominal tenderness. There  is no guarding or rebound.  Musculoskeletal:        General: Normal range of motion.     Cervical back: Neck supple.  Skin:    General: Skin is warm and dry.     Findings: No rash.  Neurological:     General: No focal deficit present.     Mental Status: She is alert.  Psychiatric:        Mood and Affect: Mood normal.        Behavior: Behavior normal.     ED Results / Procedures / Treatments   Labs (all labs ordered are listed, but only abnormal results are displayed) Labs Reviewed  COMPREHENSIVE METABOLIC PANEL - Abnormal; Notable for the following components:      Result Value   Sodium 128 (*)    Chloride 90 (*)    CO2 11 (*)    Glucose, Bld 321 (*)    BUN 72 (*)    Creatinine, Ser 4.82 (*)    Calcium 8.8 (*)     Albumin 3.0 (*)    GFR, Estimated 10 (*)    Anion gap >20 (*)    All other components within normal limits  LACTIC ACID, PLASMA - Abnormal; Notable for the following components:   Lactic Acid, Venous 5.2 (*)    All other components within normal limits  LACTIC ACID, PLASMA - Abnormal; Notable for the following components:   Lactic Acid, Venous 5.4 (*)    All other components within normal limits  CBG MONITORING, ED - Abnormal; Notable for the following components:   Glucose-Capillary 317 (*)    All other components within normal limits  CULTURE, BLOOD (ROUTINE X 2)  CULTURE, BLOOD (ROUTINE X 2)  URINE CULTURE  LIPASE, BLOOD  CBC  PROTIME-INR  APTT  URINALYSIS, ROUTINE W REFLEX MICROSCOPIC  I-STAT BETA HCG BLOOD, ED ([facility], [facility], [facility] ONLY)    EKG EKG Interpretation  Date/Time:  Friday November 03 2021 17:03:36 EDT Ventricular Rate:  108 PR Interval:  175 QRS Duration: 88 QT Interval:  327 QTC Calculation: 439 R Axis:   94 Text Interpretation: Sinus tachycardia Borderline right axis deviation Low voltage, precordial leads ST elev, probable normal early repol pattern Confirmed by Coralee Pesa [phone]) on 11/03/2021 5:44:28 PM  Radiology CT ABDOMEN PELVIS WO CONTRAST  Result Date: 11/03/2021 CLINICAL DATA:  Urinary tract infection. Fever. Hypotension. Change in bowel habits. EXAM: CT ABDOMEN AND PELVIS WITHOUT CONTRAST TECHNIQUE: Multidetector CT imaging of the abdomen and pelvis was performed following the standard protocol without IV contrast. RADIATION DOSE REDUCTION: This exam was performed according to the departmental dose-optimization program which includes automated exposure control, adjustment of the mA and/or kV according to patient size and/or use of iterative reconstruction technique. COMPARISON:  None Available. FINDINGS: Lower chest: No acute findings. Hepatobiliary: No mass visualized on this unenhanced exam. Contrast material is noted within the gallbladder which is otherwise  unremarkable in appearance. No evidence of biliary ductal dilatation. Pancreas: No mass or inflammatory process visualized on this unenhanced exam. Spleen:  Within normal limits in size. Adrenals/Urinary tract: Right renal swelling is seen with numerous areas of decreased parenchymal enhancement and mild perinephric soft tissue stranding. These findings are consistent with severe pyelonephritis. No renal abscess identified. No evidence of ureteral calculi or hydronephrosis. Unremarkable urinary bladder. Stomach/Bowel: No evidence of obstruction, inflammatory process, or abnormal fluid collections. Vascular/Lymphatic: No pathologically enlarged lymph nodes identified. No evidence of abdominal aortic aneurysm. Reproductive:  No mass or other  significant abnormality. Other:  None. Musculoskeletal:  No suspicious bone lesions identified. IMPRESSION: Severe right pyelonephritis. No evidence of renal abscess, ureteral calculus, or hydronephrosis. Electronically Signed   By: Danae Orleans M.D.   On: 11/03/2021 18:54   DG Chest Port 1 View  Result Date: 11/03/2021 CLINICAL DATA:  Possible sepsis EXAM: PORTABLE CHEST 1 VIEW COMPARISON:  09/24/2017 FINDINGS: The heart size and mediastinal contours are within normal limits. Both lungs are clear. The visualized skeletal structures are unremarkable. IMPRESSION: No active disease. Electronically Signed   By: Ernie Avena M.D.   On: 11/03/2021 16:51    Procedures .Critical Care  Performed by: Teressa Lower, PA-C Authorized by: Teressa Lower, PA-C   Critical care provider statement:    Critical care time (minutes):  60   Critical care time was exclusive of:  Separately billable procedures and treating other patients   Critical care was necessary to treat or prevent imminent or life-threatening deterioration of the following conditions:  Sepsis and renal failure   Critical care was time spent personally by me on the following activities:  Ordering and  performing treatments and interventions, ordering and review of laboratory studies, pulse oximetry, ordering and review of radiographic studies, re-evaluation of patient's condition, blood draw for specimens, development of treatment plan with patient or surrogate and discussions with consultants     Medications Ordered in ED Medications  lactated ringers infusion (has no administration in time range)  cefTRIAXone (ROCEPHIN) 2 g in sodium chloride 0.9 % 100 mL IVPB (2 g Intravenous New Bag/Given 11/03/21 1936)  lactated ringers bolus 1,000 mL (1,000 mLs Intravenous New Bag/Given 11/03/21 1751)  fentaNYL (SUBLIMAZE) injection 25 mcg (25 mcg Intravenous Given 11/03/21 1809)  lactated ringers bolus 1,000 mL (1,000 mLs Intravenous New Bag/Given 11/03/21 1844)  fentaNYL (SUBLIMAZE) injection 25 mcg (25 mcg Intravenous Given 11/03/21 1940)  lactated ringers bolus 1,000 mL (1,000 mLs Intravenous New Bag/Given 11/03/21 1941)    ED Course/ Medical Decision Making/ A&P Clinical Course as of 11/03/21 1944  Fri Nov 03, 2021  1908 CBC No evidence of leukocytosis or anemia. [CF]  1909 Comprehensive metabolic panel(!) There is evidence of significant hyponatremia, elevated glucose, and significant elevated creatinine in comparison to baseline.  She had a normal creatinine back in February. [CF]  1911 Lipase, blood Lipase negative.  I below suspicion for pancreatitis.  [CF]  1912 Protime-INR PT/INR is normal. [CF]  1912 I-Stat beta hCG blood, ED Pregnancy negative. [CF]  1912 APTT Normal  [CF]  1912 Urinalysis, Routine w reflex microscopic Still pending.  [CF]  1912 CT ABDOMEN PELVIS WO CONTRAST I personally ordered interpreted CT abdomen pelvis without contrast concern the patient has significant elevated creatinine.  There is evidence of pyelonephritis on the right that is quite severe.  I do agree with radiologist interpretation. [CF]  1941 I spoke with Dr. Leafy Half with [facility] who agrees  to admit the patient.  [CF]    Clinical Course User Index [CF] Teressa Lower, PA-C                           Medical Decision Making Jody Ball is a 51 y.o. female patient who presents to the emergency department today for further evaluation of abdominal pain and back pain.  Old records were reviewed at the bedside as patient brought paperwork.  She did have a CT scan on Tuesday which did show evidence of pyelonephritis.  She was  given ceftriaxone.  Unclear if she received additional oral antibiotics.  Patient arrived hypotensive and tachycardic and was SIRS positive.  I suspect this is likely undertreated pyelonephritis.  Undifferentiated sepsis order set was initiated in addition to 2 L of lactic Ringer's.  Blood pressure improved.   Amount and/or Complexity of Data Reviewed External Data Reviewed: notes.    Details: Highlighted in HPI. Labs: ordered. Decision-making details documented in ED Course. Radiology: ordered and independent interpretation performed. Decision-making details documented in ED Course.  Risk Prescription drug management. Parenteral controlled substances. Decision regarding hospitalization. Risk Details: Given the clinical scenario, patient was started on UTI focused antibiotics for severe undertreated pyelonephritis and profound AKI.  I do feel that the patient would benefit from further evaluation in the hospital and that she does meet sepsis criteria.  We will work on getting her admitted to the hospital.  As of right now, patient is stable.   Final Clinical Impression(s) / ED Diagnoses Final diagnoses:  Sepsis with acute renal failure and septic shock, due to unspecified organism, unspecified acute renal failure type [facility])  Pyelonephritis    Rx / DC Orders ED Discharge Orders     None         Teressa Lower, PA-C 11/03/21 1944    Rozelle Logan, DO 11/03/21 2013

## 2021-11-03 NOTE — Assessment & Plan Note (Signed)
   Holding home regimen of oral antihypertensive therapy  As needed intravenous antihypertensives for markedly elevated blood pressure  Monitoring for any evidence of hypotension in the setting of sepsis

## 2021-11-03 NOTE — ED Provider Triage Note (Signed)
Emergency Medicine Provider Triage Evaluation Note  Jody Ball , a 51 y.o. female  was evaluated in triage.  Pt complains of abdominal pain. She states that same has been ongoing since Monday. She states that on Tuesday she went to her PCP and had labs drawn and a CT scan scheduled. She states that her urine showed signs of infection and she was given Rocephin for same. She had the CT scan Wednesday which showed acute pyelonephritis of the right kidney and ' no normal renal excretion of contrast from either kidney despite up to 9 minutes of delayed imaging compatible with acute and transient renal insufficiency.' Given these findings with patient's worsening pain she was sent here for further evaluation and management of same.  Patient also states that over the past few days she has been having fevers.  She states that this morning her temperature was 104 and she has been taking Tylenol for same with the last dose of Tylenol earlier this morning.  Patient states that she is still yet to have a bowel movement since Monday and has been nauseous without any vomiting.  States that her pain is in her lower abdomen and radiates into her bilateral flanks.  Review of Systems  Positive:  Negative: See above  Physical Exam  BP (!) 98/53 (BP Location: Right Arm)   Pulse (!) 115   Temp 98.7 F (37.1 C) (Oral)   Resp 18   Ht 5\' 5"  (1.651 m)   Wt 66.7 kg   LMP 10/19/2021 (Approximate)   SpO2 100%   BMI 24.46 kg/m  Gen:   Awake, no distress   Resp:  Normal effort  MSK:   Moves extremities without difficulty  Other:    Medical Decision Making  Medically screening exam initiated at 4:29 PM.  Appropriate orders placed.  Jody Ball was informed that the remainder of the evaluation will be completed by another provider, this initial triage assessment does not replace that evaluation, and the importance of remaining in the ED until their evaluation is complete.  Concern for sepsis given patient with known  pyelonephritis.  She is also hypotensive and tachycardic with fevers at home.  Therefore patient will require the next room available. Nursing staff notified of same.   Jody Sciara, PA-C 11/03/21 1702

## 2021-11-03 NOTE — Assessment & Plan Note (Signed)
   Profound metabolic acidosis that is likely multifactorial secondary to lactic acidosis and diabetic ketoacidosis simultaneously   Due to hemodynamic instability and worsening pH will provide patient with 1 amp of sodium bicarbonate push followed by an infusion of sodium bicarbonate for now until hemodynamics improved  Hydrating patient intravenous isotonic fluids while concurrently treating underlying infection  Monitoring serial lactic acid levels to ensure downtrending and resolution.  Managing concurrent DKA with insulin infusion

## 2021-11-03 NOTE — Assessment & Plan Note (Addendum)
.   Patient exhibiting evidence of acute kidney injury secondary to severe volume depletion, sepsis and pyelonephritis . Creatinine is currently 4.82 an increase compared to baseline of 0.53 . Hydrating patient with intravenous isotonic fluids. . Holding home regimen of hydrochlorothiazide and lisinopril. . Strict input and output monitoring . Monitoring renal function and electrolytes with serial chemistries . Avoiding nephrotoxic agents if at all possible . Obtaining urine electrolytes, CT imaging of the abdomen pelvis reveals no evidence of hydronephrosis or stone

## 2021-11-03 NOTE — ED Provider Notes (Signed)
50 year old female presents with abdominal/back pain.  Recent treatment of UTI/pyelonephritis.  SIRS positive on arrival.  Hypotensive, tachycardic, elevated lactic, no significant reduction with IV hydration.  Concern for sepsis/septic shock, plan for aggressive IV hydration and antibiotic.  Repeat CT scan done without due to renal failure confirms pyelonephritis findings.  Will admit.  .Critical Care  Performed by: Rozelle Logan, DO Authorized by: Rozelle Logan, DO   Critical care provider statement:    Critical care time (minutes):  30   Critical care time was exclusive of:  Separately billable procedures and treating other patients   Critical care was necessary to treat or prevent imminent or life-threatening deterioration of the following conditions:  Sepsis and shock   Critical care was time spent personally by me on the following activities:  Development of treatment plan with patient or surrogate, discussions with consultants, evaluation of patient's response to treatment, examination of patient, ordering and review of laboratory studies, ordering and review of radiographic studies, ordering and performing treatments and interventions, pulse oximetry, re-evaluation of patient's condition and review of old charts   Care discussed with: admitting provider       Rozelle Logan, DO 11/03/21 2015

## 2021-11-03 NOTE — H&P (Signed)
History and Physical    Patient: Jody Ball MRN: [id number] DOA: 11/03/2021  Date of Service: the patient was seen and examined on 11/04/2021  Patient coming from: Home  Chief Complaint:  Chief Complaint  Patient presents with   Abnormal Lab   Hyperglycemia    HPI:   51 year old female with past medical history hypertension, hyperlipidemia, non-insulin-dependent diabetes mellitus type 2 who presents to [facility] emergency department with complaints of malaise and fever.  Patient is explained that on Sunday patient began to experience a feeling of generalized malaise and weakness.  Over the span of time the patient is also developing some degree of right flank pain, moderate to severe intensity, radiating down into the, worse with movement and improved with rest.  Patient is also complaining of associated and chills.  Due to progressively worsening symptoms patient mentioned primary care provider on Tuesday.  During that visit, her urinalysis was suggestive of urinary tract infection and CT imaging of the abdomen pelvis revealed right-sided polynephritis.  Patient was administered a dose of intramuscular ceftriaxone at the time and sent home on oral antibiotics. Informed patient continue to worsen reports a fever of 104 F this morning.  Due to persistent fevers and worsening flank pain patient initially presented to [facility] emergency department for evaluation.  Evaluation in the emergency department repeat CT imaging of the abdomen pelvis with contrast revealed severe right-sided pyelonephritis without evidence of abscess, emphysematous pyelonephritis or obstructive stone.  ER provider administered 2 L of isotonic fluids and 2 g of intravenous ceftriaxone.  Hospitalist group was then called to assess the patient for admission to the hospital.  Review of Systems: Review of Systems  Constitutional:  Positive for chills, fever and malaise/fatigue.  Genitourinary:   Positive for flank pain.  Neurological:  Positive for weakness.  All other systems reviewed and are negative.    Past Medical History:  Diagnosis Date   Diabetes mellitus without complication (HCC)    High cholesterol    Hypertension     Past Surgical History:  Procedure Laterality Date   BUNIONECTOMY Left    VARICOSE VEIN SURGERY Bilateral     Social History:  reports that she has never smoked. She has never used smokeless tobacco. She reports current alcohol use. She reports that she does not use drugs.  No Known Allergies  Family History  Problem Relation Age of Onset   Heart disease Neg Hx     Prior to Admission medications   Medication Sig Start Date End Date Taking? Authorizing Provider  atorvastatin (LIPITOR) 20 MG tablet Take 20 mg by mouth daily. 09/07/17   [provider]  fluticasone (FLONASE) 50 MCG/ACT nasal spray Place 2 sprays into both nostrils 2 (two) times daily as needed for allergies or rhinitis.  09/10/17   [provider]  JARDIANCE 10 MG TABS tablet Take 10 mg by mouth daily. 09/10/17   [provider]  lisinopril (PRINIVIL,ZESTRIL) 5 MG tablet Take 2 tablets (10 mg total) by mouth every evening. 09/24/17   Mancel Bale, MD  LORazepam (ATIVAN) 0.5 MG tablet Take 2 tablets (1 mg total) by mouth every 6 (six) hours as needed for anxiety. 09/24/17   Mancel Bale, MD  metFORMIN (GLUCOPHAGE-XR) 500 MG 24 hr tablet Take 500 mg by mouth 2 (two) times daily. 09/20/17   [provider]  SPRINTEC 28 0.25-35 MG-MCG tablet Take 1 tablet by mouth daily. 09/10/17   [provider]    Physical Exam:  Vitals:   11/04/21 0050 11/04/21 0100 11/04/21 0115 11/04/21 0145  BP: (!) 77/38 (!) 80/31 (!) 83/44 (!) 73/41  Pulse: (!) 101 (!) 103 (!) 101 99  Resp: (!) 31 (!) 32 (!) 30 (!) 27  Temp:      TempSrc:      SpO2: 99% 100% 100% 100%  Weight:      Height:        Constitutional: Lethargic but arousable and oriented x3,  patient is in distress due to abdominal pain  Skin: no rashes, no lesions, poor skin turgor noted. Eyes: Pupils are equally reactive to light.  No evidence of scleral icterus or conjunctival pallor.  ENMT: Extremely dry mucous membranes noted.  Posterior pharynx clear of any exudate or lesions.   Neck: normal, supple, no masses, no thyromegaly.  No evidence of jugular venous distension.   Respiratory: Mild bibasilar rales noted without evidence of wheezing.  Normal respiratory effort. No accessory muscle use.  Cardiovascular: Tachycardic rate with regular rhythm, no murmurs / rubs / gallops. No extremity edema. 2+ pedal pulses. No carotid bruits.  Chest:   Nontender without crepitus or deformity.   Back:   Bilateral flank tenderness without crepitus or deformity. Abdomen: Diffusely tender abdomen.  Abdomen is soft however.  No evidence of intra-abdominal masses.  Positive bowel sounds noted in all quadrants.   Musculoskeletal: No joint deformity upper and lower extremities. Good ROM, no contractures. Normal muscle tone.  Neurologic: Patient is lethargic but arousable and oriented x3.  Patient is following commands.  Sensation is grossly intact.  Patient moving all 4 extremities spontaneously.  Patient is responsive to verbal stimuli.   Psychiatric: Difficult to assess due to significant lethargy, likely normal mood and appropriate affect.  Patient currently does seem to possess insight as to her current situation.    Data Reviewed:  I have personally reviewed and interpreted labs, imaging.  Significant findings are:  CT imaging of the abdomen pelvis revealing severe right-sided abscess, obstructing stone or emphysema. Chest x-ray personally reviewed revealing no evidence of acute cardiopulmonary disease. Initial lactate 5.2, second lactate 5.4  Lab Results  Component Value Date   WBC 9.6 11/03/2021   HGB 12.7 11/03/2021   HCT 37.9 11/03/2021   MCV 96.9 11/03/2021   PLT 238 11/03/2021     EKG: Personally reviewed.  Rhythm is sinus tachycardia with heart rate of 108 bpm.  No dynamic ST segment changes appreciated.   Assessment and Plan: * Septic shock due to urinary tract infection [facility]) Patient presenting with severe right-sided pyelonephritis in the setting of multiple SIRS criteria and evidence of organ dysfunction with acute kidney injury all concerning for sepsis with organ dysfunction Throughout the evening despite nearly 5 L of isotonic fluids being administered patient is becoming progressively more hypotensive suggestive of septic shock Likely gram-negative organism as the cause Patient has been clinically worsening over the past 48 hours despite receiving a recent dose of intramuscular ceftriaxone. Due to severity of illness patient was initially placed on Zosyn but through the evening the patient has clinically decompensated and therefore we are additionally adding vancomycin. Hydrating patient aggressively with intravenous isotonic fluids Due to significant hemodynamic compromise despite aggressive fluids we will additionally add Levophed and ask our PCCM partners to come evaluate patient to potentially go to their service.   Patient will likely need central line placement. Blood cultures and urine cultures obtained, antibiotic therapy will be deescalated based on these results Obtaining serial lactic acid levels Close  clinical monitoring as patient is at high risk of rapid clinical decompensation   Acute kidney injury (AKI) with acute tubular necrosis (ATN) (HCC) Patient exhibiting evidence of acute kidney injury secondary to severe volume depletion, sepsis and pyelonephritis Creatinine is currently 4.82 an increase compared to baseline of 0.53 Hydrating patient with intravenous isotonic fluids. Holding home regimen of hydrochlorothiazide and lisinopril. Strict input and output monitoring Monitoring renal function and electrolytes with serial  chemistries Avoiding nephrotoxic agents if at all possible Obtaining urine electrolytes, CT imaging of the abdomen pelvis reveals no evidence of hydronephrosis or stone   Metabolic acidosis Profound metabolic acidosis that is likely multifactorial secondary to lactic acidosis and diabetic ketoacidosis simultaneously  Due to hemodynamic instability and worsening pH will provide patient with 1 amp of sodium bicarbonate push followed by an infusion of sodium bicarbonate for now until hemodynamics improved Hydrating patient intravenous isotonic fluids while concurrently treating underlying infection Monitoring serial lactic acid levels to ensure downtrending and resolution. Managing concurrent DKA with insulin infusion    Uncontrolled type 2 diabetes mellitus with hyperglycemia, without long-term current use of insulin (HCC) Substantial hyperglycemia in the setting of sepsis  Evidence of wide anion gap and markedly elevated beta hydroxybutyrate supportive of DKA. Patient has been initiated on insulin infusion Aggressive intravenous volume resuscitation Holding home regimen of oral hypoglycemics Hemoglobin A1C ordered N.p.o. for now   Hyponatremia Modest hyponatremia likely secondary to hyperglycemia  There may be an element of true hyponatremia underlying this due to volume depletion  hydrating patient with intravenous isotonic fluids Monitoring sodium levels with serial chemistries  Essential hypertension Holding home regimen of oral antihypertensive therapy in the setting of hypotension As needed intravenous antihypertensives for markedly elevated blood pressure  Mixed diabetic hyperlipidemia associated with type 2 diabetes mellitus (HCC) Holding statin therapy for now while patient is n.p.o.        Code Status:  Full code  code status decision has been confirmed with: patient/husband Family Communication: Husband has been updated on plan of care at the bedside  Consults:  Dr. Gaynell Face with PCCM  Severity of Illness:  The appropriate patient status for this patient is INPATIENT. Inpatient status is judged to be reasonable and necessary in order to provide the required intensity of service to ensure the patient's safety. The patient's presenting symptoms, physical exam findings, and initial radiographic and laboratory data in the context of their chronic comorbidities is felt to place them at high risk for further clinical deterioration. Furthermore, it is not anticipated that the patient will be medically stable for discharge from the hospital within 2 midnights of admission.   * I certify that at the point of admission it is my clinical judgment that the patient will require inpatient hospital care spanning beyond 2 midnights from the point of admission due to high intensity of service, high risk for further deterioration and high frequency of surveillance required.*  CRITICAL CARE ATTESTATION  Patient is at significant risk of morbidity and mortality due to sepsis with multiorgan dysfunction and developing septic shock as well as worsening lactic acidosis and diabetic ketoacidosis.  Ordering frequent labs with interpretation including interpretation of blood gases, titration of broad-spectrum intravenous antibiotics, aggressive administration of intravenous isotonic fluids, administration of vasoactive agents to maintain hemodynamic stability, initiation and titration of insulin infusion and interpretation of numerous labs and radiology.  I have also coordinated with subspecialists with critical care and discussed plan of care with family.  Critical care time spent 71 minutes.  Author:  Marinda Elk MD  11/04/2021 2:30 AM

## 2021-11-04 ENCOUNTER — Inpatient Hospital Stay (HOSPITAL_COMMUNITY): Payer: BC Managed Care – PPO

## 2021-11-04 DIAGNOSIS — N179 Acute kidney failure, unspecified: Secondary | ICD-10-CM | POA: Diagnosis not present

## 2021-11-04 DIAGNOSIS — A419 Sepsis, unspecified organism: Secondary | ICD-10-CM | POA: Diagnosis not present

## 2021-11-04 DIAGNOSIS — R6521 Severe sepsis with septic shock: Secondary | ICD-10-CM | POA: Diagnosis not present

## 2021-11-04 LAB — BLOOD GAS, ARTERIAL
Acid-base deficit: 25.7 mmol/L — ABNORMAL HIGH (ref 0.0–2.0)
Bicarbonate: 3.4 mmol/L — ABNORMAL LOW (ref 20.0–28.0)
O2 Saturation: 99.9 %
Patient temperature: 35.9
pCO2 arterial: <18 mmHg — CL (ref 32–48)
pH, Arterial: 7.03 — CL (ref 7.35–7.45)
pO2, Arterial: 116 mmHg — ABNORMAL HIGH (ref 83–108)

## 2021-11-04 LAB — BASIC METABOLIC PANEL
Anion gap: >20 — ABNORMAL HIGH (ref 5–15)
Anion gap: 17 — ABNORMAL HIGH (ref 5–15)
BUN: 67 mg/dL — ABNORMAL HIGH (ref 6–20)
BUN: 62 mg/dL — ABNORMAL HIGH (ref 6–20)
BUN: 61 mg/dL — ABNORMAL HIGH (ref 6–20)
CO2: <7 mmol/L — ABNORMAL LOW (ref 22–32)
CO2: 13 mmol/L — ABNORMAL LOW (ref 22–32)
CO2: 20 mmol/L — ABNORMAL LOW (ref 22–32)
Calcium: 8.5 mg/dL — ABNORMAL LOW (ref 8.9–10.3)
Calcium: 7 mg/dL — ABNORMAL LOW (ref 8.9–10.3)
Calcium: 7.3 mg/dL — ABNORMAL LOW (ref 8.9–10.3)
Chloride: 92 mmol/L — ABNORMAL LOW (ref 98–111)
Chloride: 93 mmol/L — ABNORMAL LOW (ref 98–111)
Chloride: 94 mmol/L — ABNORMAL LOW (ref 98–111)
Creatinine, Ser: 4.97 mg/dL — ABNORMAL HIGH (ref 0.44–1.00)
Creatinine, Ser: 4.5 mg/dL — ABNORMAL HIGH (ref 0.44–1.00)
Creatinine, Ser: 3.78 mg/dL — ABNORMAL HIGH (ref 0.44–1.00)
GFR, Estimated: 10 mL/min — ABNORMAL LOW (ref >60)
GFR, Estimated: 11 mL/min — ABNORMAL LOW (ref >60)
GFR, Estimated: 14 mL/min — ABNORMAL LOW (ref >60)
Glucose, Bld: 374 mg/dL — ABNORMAL HIGH (ref 70–99)
Glucose, Bld: 196 mg/dL — ABNORMAL HIGH (ref 70–99)
Glucose, Bld: 124 mg/dL — ABNORMAL HIGH (ref 70–99)
Potassium: 4.6 mmol/L (ref 3.5–5.1)
Potassium: 3.4 mmol/L — ABNORMAL LOW (ref 3.5–5.1)
Potassium: 3.2 mmol/L — ABNORMAL LOW (ref 3.5–5.1)
Sodium: 130 mmol/L — ABNORMAL LOW (ref 135–145)
Sodium: 128 mmol/L — ABNORMAL LOW (ref 135–145)
Sodium: 131 mmol/L — ABNORMAL LOW (ref 135–145)

## 2021-11-04 LAB — COMPREHENSIVE METABOLIC PANEL
ALT: 17 U/L (ref 0–44)
ALT: 17 U/L (ref 0–44)
AST: 21 U/L (ref 15–41)
AST: 24 U/L (ref 15–41)
Albumin: 2 g/dL — ABNORMAL LOW (ref 3.5–5.0)
Albumin: 2.1 g/dL — ABNORMAL LOW (ref 3.5–5.0)
Alkaline Phosphatase: 51 U/L (ref 38–126)
Alkaline Phosphatase: 56 U/L (ref 38–126)
Anion gap: >20 — ABNORMAL HIGH (ref 5–15)
Anion gap: >20 — ABNORMAL HIGH (ref 5–15)
BUN: 57 mg/dL — ABNORMAL HIGH (ref 6–20)
BUN: 63 mg/dL — ABNORMAL HIGH (ref 6–20)
CO2: 18 mmol/L — ABNORMAL LOW (ref 22–32)
CO2: 9 mmol/L — ABNORMAL LOW (ref 22–32)
Calcium: 6.7 mg/dL — ABNORMAL LOW (ref 8.9–10.3)
Calcium: 7.7 mg/dL — ABNORMAL LOW (ref 8.9–10.3)
Chloride: 80 mmol/L — ABNORMAL LOW (ref 98–111)
Chloride: 92 mmol/L — ABNORMAL LOW (ref 98–111)
Creatinine, Ser: 4.19 mg/dL — ABNORMAL HIGH (ref 0.44–1.00)
Creatinine, Ser: 4.9 mg/dL — ABNORMAL HIGH (ref 0.44–1.00)
GFR, Estimated: 12 mL/min — ABNORMAL LOW (ref >60)
GFR, Estimated: 10 mL/min — ABNORMAL LOW (ref >60)
Glucose, Bld: 252 mg/dL — ABNORMAL HIGH (ref 70–99)
Glucose, Bld: 195 mg/dL — ABNORMAL HIGH (ref 70–99)
Potassium: 3.4 mmol/L — ABNORMAL LOW (ref 3.5–5.1)
Potassium: 4 mmol/L (ref 3.5–5.1)
Sodium: 127 mmol/L — ABNORMAL LOW (ref 135–145)
Sodium: 128 mmol/L — ABNORMAL LOW (ref 135–145)
Total Bilirubin: 1.3 mg/dL — ABNORMAL HIGH (ref 0.3–1.2)
Total Bilirubin: 1.6 mg/dL — ABNORMAL HIGH (ref 0.3–1.2)
Total Protein: 4.8 g/dL — ABNORMAL LOW (ref 6.5–8.1)
Total Protein: 5.2 g/dL — ABNORMAL LOW (ref 6.5–8.1)

## 2021-11-04 LAB — GLUCOSE, CAPILLARY
Glucose-Capillary: 123 mg/dL — ABNORMAL HIGH (ref 70–99)
Glucose-Capillary: 140 mg/dL — ABNORMAL HIGH (ref 70–99)
Glucose-Capillary: 163 mg/dL — ABNORMAL HIGH (ref 70–99)
Glucose-Capillary: 168 mg/dL — ABNORMAL HIGH (ref 70–99)
Glucose-Capillary: 176 mg/dL — ABNORMAL HIGH (ref 70–99)
Glucose-Capillary: 178 mg/dL — ABNORMAL HIGH (ref 70–99)
Glucose-Capillary: 179 mg/dL — ABNORMAL HIGH (ref 70–99)
Glucose-Capillary: 187 mg/dL — ABNORMAL HIGH (ref 70–99)
Glucose-Capillary: 194 mg/dL — ABNORMAL HIGH (ref 70–99)
Glucose-Capillary: 197 mg/dL — ABNORMAL HIGH (ref 70–99)
Glucose-Capillary: 201 mg/dL — ABNORMAL HIGH (ref 70–99)
Glucose-Capillary: 201 mg/dL — ABNORMAL HIGH (ref 70–99)
Glucose-Capillary: 206 mg/dL — ABNORMAL HIGH (ref 70–99)
Glucose-Capillary: 219 mg/dL — ABNORMAL HIGH (ref 70–99)
Glucose-Capillary: 269 mg/dL — ABNORMAL HIGH (ref 70–99)
Glucose-Capillary: 291 mg/dL — ABNORMAL HIGH (ref 70–99)
Glucose-Capillary: 313 mg/dL — ABNORMAL HIGH (ref 70–99)
Glucose-Capillary: 335 mg/dL — ABNORMAL HIGH (ref 70–99)
Glucose-Capillary: 364 mg/dL — ABNORMAL HIGH (ref 70–99)

## 2021-11-04 LAB — CORTISOL-AM, BLOOD: Cortisol - AM: 43.9 ug/dL — ABNORMAL HIGH (ref 6.7–22.6)

## 2021-11-04 LAB — CBC WITH DIFFERENTIAL/PLATELET
Abs Immature Granulocytes: 0.52 10*3/uL — ABNORMAL HIGH (ref 0.00–0.07)
Basophils Absolute: 0.1 10*3/uL (ref 0.0–0.1)
Basophils Relative: 0 %
Eosinophils Absolute: 0 10*3/uL (ref 0.0–0.5)
Eosinophils Relative: 0 %
HCT: 31.7 % — ABNORMAL LOW (ref 36.0–46.0)
Hemoglobin: 10.2 g/dL — ABNORMAL LOW (ref 12.0–15.0)
Immature Granulocytes: 3 %
Lymphocytes Relative: 8 %
Lymphs Abs: 1.5 10*3/uL (ref 0.7–4.0)
MCH: 32.1 pg (ref 26.0–34.0)
MCHC: 32.2 g/dL (ref 30.0–36.0)
MCV: 99.7 fL (ref 80.0–100.0)
Monocytes Absolute: 0.9 10*3/uL (ref 0.1–1.0)
Monocytes Relative: 5 %
Neutro Abs: 16.2 10*3/uL — ABNORMAL HIGH (ref 1.7–7.7)
Neutrophils Relative %: 84 %
Platelets: 256 10*3/uL (ref 150–400)
RBC: 3.18 MIL/uL — ABNORMAL LOW (ref 3.87–5.11)
RDW: 12.6 % (ref 11.5–15.5)
WBC: 19.1 10*3/uL — ABNORMAL HIGH (ref 4.0–10.5)
nRBC: 0 % (ref 0.0–0.2)

## 2021-11-04 LAB — HEMOGLOBIN A1C
Hgb A1c MFr Bld: 7.8 % — ABNORMAL HIGH (ref 4.8–5.6)
Mean Plasma Glucose: 177.16 mg/dL

## 2021-11-04 LAB — CBC
HCT: 32.9 % — ABNORMAL LOW (ref 36.0–46.0)
Hemoglobin: 10.8 g/dL — ABNORMAL LOW (ref 12.0–15.0)
MCH: 32.4 pg (ref 26.0–34.0)
MCHC: 32.8 g/dL (ref 30.0–36.0)
MCV: 98.8 fL (ref 80.0–100.0)
Platelets: 254 10*3/uL (ref 150–400)
RBC: 3.33 MIL/uL — ABNORMAL LOW (ref 3.87–5.11)
RDW: 12.8 % (ref 11.5–15.5)
WBC: 16.7 10*3/uL — ABNORMAL HIGH (ref 4.0–10.5)
nRBC: 0 % (ref 0.0–0.2)

## 2021-11-04 LAB — BLOOD GAS, VENOUS
Acid-Base Excess: 2 mmol/L (ref 0.0–2.0)
Acid-base deficit: 15 mmol/L — ABNORMAL HIGH (ref 0.0–2.0)
Bicarbonate: 10.3 mmol/L — ABNORMAL LOW (ref 20.0–28.0)
Bicarbonate: 25.1 mmol/L (ref 20.0–28.0)
O2 Saturation: 82.1 %
O2 Saturation: 82.9 %
Patient temperature: 37
Patient temperature: 37
pCO2, Ven: 23 mmHg — ABNORMAL LOW (ref 44–60)
pCO2, Ven: 33 mmHg — ABNORMAL LOW (ref 44–60)
pH, Ven: 7.26 (ref 7.25–7.43)
pH, Ven: 7.49 — ABNORMAL HIGH (ref 7.25–7.43)
pO2, Ven: 47 mmHg — ABNORMAL HIGH (ref 32–45)
pO2, Ven: 43 mmHg (ref 32–45)

## 2021-11-04 LAB — TROPONIN I (HIGH SENSITIVITY)
Troponin I (High Sensitivity): 655 ng/L (ref <18)
Troponin I (High Sensitivity): 2495 ng/L (ref <18)
Troponin I (High Sensitivity): 1853 ng/L (ref <18)

## 2021-11-04 LAB — HIV ANTIBODY (ROUTINE TESTING W REFLEX): HIV Screen 4th Generation wRfx: NONREACTIVE

## 2021-11-04 LAB — MAGNESIUM
Magnesium: 2.2 mg/dL (ref 1.7–2.4)
Magnesium: 2.3 mg/dL (ref 1.7–2.4)

## 2021-11-04 LAB — ECHOCARDIOGRAM COMPLETE
Area-P 1/2: 5.31 cm2
Height: 65 in
S' Lateral: 2.7 cm
Weight: 2507.95 oz

## 2021-11-04 LAB — CREATININE, URINE, RANDOM: Creatinine, Urine: 34.57 mg/dL

## 2021-11-04 LAB — BETA-HYDROXYBUTYRIC ACID
Beta-Hydroxybutyric Acid: >8 mmol/L — ABNORMAL HIGH (ref 0.05–0.27)
Beta-Hydroxybutyric Acid: >8 mmol/L — ABNORMAL HIGH (ref 0.05–0.27)
Beta-Hydroxybutyric Acid: 4.9 mmol/L — ABNORMAL HIGH (ref 0.05–0.27)
Beta-Hydroxybutyric Acid: 0.74 mmol/L — ABNORMAL HIGH (ref 0.05–0.27)

## 2021-11-04 LAB — PHOSPHORUS: Phosphorus: 5.5 mg/dL — ABNORMAL HIGH (ref 2.5–4.6)

## 2021-11-04 LAB — PROCALCITONIN: Procalcitonin: 24.47 ng/mL

## 2021-11-04 LAB — LACTIC ACID, PLASMA
Lactic Acid, Venous: 8.1 mmol/L (ref 0.5–1.9)
Lactic Acid, Venous: 5.2 mmol/L (ref 0.5–1.9)
Lactic Acid, Venous: 2.8 mmol/L (ref 0.5–1.9)

## 2021-11-04 LAB — MRSA NEXT GEN BY PCR, NASAL: MRSA by PCR Next Gen: NOT DETECTED

## 2021-11-04 LAB — SODIUM, URINE, RANDOM: Sodium, Ur: 55 mmol/L

## 2021-11-04 MED ORDER — LACTATED RINGERS IV BOLUS
1000.0000 mL | Freq: Once | INTRAVENOUS | Status: AC
  Administered 2021-11-04: 1000 mL via INTRAVENOUS

## 2021-11-04 MED ORDER — SODIUM BICARBONATE 8.4 % IV SOLN
INTRAVENOUS | Status: DC
  Filled 2021-11-04: qty 1000
  Filled 2021-11-04: qty 150
  Filled 2021-11-04: qty 1000
  Filled 2021-11-04: qty 150

## 2021-11-04 MED ORDER — SODIUM CHLORIDE 0.9 % IV SOLN: 250.0000 mL | INTRAVENOUS | Status: DC

## 2021-11-04 MED ORDER — VANCOMYCIN VARIABLE DOSE PER UNSTABLE RENAL FUNCTION (PHARMACIST DOSING): Status: DC

## 2021-11-04 MED ORDER — NALOXONE HCL 0.4 MG/ML IJ SOLN: 0.4000 mg | INTRAMUSCULAR | Status: DC | PRN

## 2021-11-04 MED ORDER — MAGIC MOUTHWASH
2.0000 mL | Freq: Three times a day (TID) | ORAL | Status: DC
  Administered 2021-11-04 – 2021-11-05 (×4): 2 mL via ORAL
  Filled 2021-11-04 (×8): qty 5

## 2021-11-04 MED ORDER — POTASSIUM CHLORIDE 10 MEQ/50ML IV SOLN: 10.0000 meq | INTRAVENOUS | Status: DC

## 2021-11-04 MED ORDER — NOREPINEPHRINE 4 MG/250ML-% IV SOLN: 0.0000 ug/min | INTRAVENOUS | Status: DC

## 2021-11-04 MED ORDER — HYDROMORPHONE HCL 1 MG/ML IJ SOLN: 1.0000 mg | INTRAMUSCULAR | Status: DC | PRN

## 2021-11-04 MED ORDER — NOREPINEPHRINE 4 MG/250ML-% IV SOLN
2.0000 ug/min | INTRAVENOUS | Status: DC
  Administered 2021-11-04: 2 ug/min via INTRAVENOUS
  Filled 2021-11-04: qty 250

## 2021-11-04 MED ORDER — FENTANYL CITRATE PF 50 MCG/ML IJ SOSY
50.0000 ug | PREFILLED_SYRINGE | INTRAMUSCULAR | Status: DC | PRN
  Administered 2021-11-04 – 2021-11-07 (×8): 50 ug via INTRAVENOUS
  Filled 2021-11-04 (×10): qty 1

## 2021-11-04 MED ORDER — CHLORHEXIDINE GLUCONATE CLOTH 2 % EX PADS
6.0000 | MEDICATED_PAD | Freq: Every day | CUTANEOUS | Status: DC
  Administered 2021-11-04 – 2021-11-07 (×3): 6 via TOPICAL

## 2021-11-04 MED ORDER — STERILE WATER FOR INJECTION IV SOLN
INTRAVENOUS | Status: DC
  Filled 2021-11-04: qty 150

## 2021-11-04 MED ORDER — KCL-LACTATED RINGERS-D5W 20 MEQ/L IV SOLN
INTRAVENOUS | Status: DC
  Filled 2021-11-04: qty 1000

## 2021-11-04 MED ORDER — FENTANYL CITRATE PF 50 MCG/ML IJ SOSY
25.0000 ug | PREFILLED_SYRINGE | INTRAMUSCULAR | Status: DC | PRN
  Administered 2021-11-04 – 2021-11-05 (×3): 25 ug via INTRAVENOUS
  Filled 2021-11-04 (×2): qty 1

## 2021-11-04 MED ORDER — LACTATED RINGERS IV BOLUS
2000.0000 mL | Freq: Once | INTRAVENOUS | Status: AC
  Administered 2021-11-04: 2000 mL via INTRAVENOUS

## 2021-11-04 MED ORDER — SODIUM CHLORIDE 0.9 % IV BOLUS
2000.0000 mL | Freq: Once | INTRAVENOUS | Status: AC
  Administered 2021-11-04: 2000 mL via INTRAVENOUS

## 2021-11-04 MED ORDER — ASPIRIN 81 MG PO CHEW
81.0000 mg | CHEWABLE_TABLET | Freq: Every day | ORAL | Status: DC
Start: 2021-11-04 — End: 2021-11-07
  Administered 2021-11-04 – 2021-11-07 (×4): 81 mg via ORAL
  Filled 2021-11-04 (×4): qty 1

## 2021-11-04 MED ORDER — SODIUM BICARBONATE 8.4 % IV SOLN
50.0000 meq | Freq: Once | INTRAVENOUS | Status: AC
  Administered 2021-11-04: 50 meq via INTRAVENOUS
  Filled 2021-11-04: qty 50

## 2021-11-04 MED ORDER — VANCOMYCIN HCL 1500 MG/300ML IV SOLN
1500.0000 mg | Freq: Once | INTRAVENOUS | Status: AC
  Administered 2021-11-04: 1500 mg via INTRAVENOUS
  Filled 2021-11-04: qty 300

## 2021-11-04 NOTE — Progress Notes (Addendum)
eLink Physician-Brief Progress Note Patient Name: Jody Ball DOB: Dec 23, 1970 MRN: [id number]   Date of Service  11/04/2021  HPI/Events of Note  K+ 3.4, patient is anuric at the moment.  eICU Interventions  Will trend K+ level and hold off giving iv K+ until urine output is documented.        [name] 11/04/2021, 5:11 AM

## 2021-11-04 NOTE — Progress Notes (Signed)
Pharmacy Antibiotic Note  Jody Ball is a 51 y.o. female admitted on 11/03/2021 with sepsis.  Pharmacy has been consulted for vancomycin dosing.  Plan: Vancomycin 1500mg  IV x 1 Due to AKI will obtain random vanc trough in 24-48 hours for further dosing  Height: 5\' 5"  (165.1 cm) Weight: 71.1 kg (156 lb 12 oz) IBW/kg (Calculated) : 57  Temp (24hrs), Avg:97.7 F (36.5 C), Min:96.6 F (35.9 C), Max:98.7 F (37.1 C)  Recent Labs  Lab 11/03/21 1631 11/03/21 1651 11/03/21 1800 11/03/21 2118 11/04/21 0035  WBC 9.6  --   --   --   --   CREATININE 4.82*  --   --   --  4.97*  LATICACIDVEN  --  5.2* 5.4* 7.1* 8.1*    Estimated Creatinine Clearance: 13.1 mL/min (A) (by C-G formula based on SCr of 4.97 mg/dL (H)).    No Known Allergies   Thank you for allowing pharmacy to be a part of this patient's care.  2119 RPh 11/04/2021, 2:02 AM

## 2021-11-04 NOTE — Progress Notes (Signed)
eLink Physician-Brief Progress Note Patient Name: Jody Ball DOB: Aug 30, 1970 MRN: [id number]   Date of Service  11/04/2021  HPI/Events of Note  Patient admitted with septic shock secondary to acute pyelonephritis, DKA, acute kidney injury, and severe metabolic acidosis.  eICU Interventions  New Patient Evaluation.        Thomasene Lot Velena Keegan 11/04/2021, 4:09 AM

## 2021-11-04 NOTE — Progress Notes (Signed)
   Increaesd trop C/o chest pain DM +   But Septic EKG normal ECHO -per Dr Odis Hollingshead - no focal abn. Good EF AKI +  Plan -d/w Dr Odis Hollingshead - no indication for cath  - IV heparin if enzumes rising or EKG change c/w ischemia - observe with serioal trop for now    SIGNATURE    Dr. Kalman Shan, M.D., F.C.C.P,  Pulmonary and Critical Care Medicine Staff Physician, [facility] Director - Interstitial Lung Disease  Program  Medical Director - [facility] Pulmonary Fibrosis [facility] at [place], [place], [address]  NPI Number:  NPI #[id number] [facility] Number: [id number]  Pager: [phone], If no answer  -> Check AMION or Try [phone] Telephone (clinical office): [phone] Telephone (research): [phone]  11:35 AM 11/04/2021

## 2021-11-04 NOTE — Progress Notes (Signed)
Off pressors.  Lactic acid almost resolved.  Bicarb is now greater than 20.  VBG shows pH still at 7.2.  Beta hydroxybutyrate acid is improved but still elevated  Plan - Continue DKA protocol including bicarb infusion - Check VBG and check beta hydroxybutyrate acid at 9 PM and electronic MD to decide on stopping bicarb -DC [facility] from the [facility] -Transfer patient to stepdown unit - Discussed with Dr. Carmell Austria -hospitalist to be primary 11/05/2021 and CCM of     SIGNATURE    Dr. Kalman Shan, M.D., F.C.C.P,  Pulmonary and Critical Care Medicine Staff Physician, [facility] Director - Interstitial Lung Disease  Program  Medical Director - [facility] Fib[facility] at [place], [place], [address]  NPI Number:  NPI #[id number] DEA Number: [id number]  Pager: [phone], If no answer  -> Check AMION or Try [id number] Telephone (clinical office): [phone] Telephone (research): [phone]  5:34 PM 11/04/2021   LABS    PULMONARY Recent Labs  Lab 11/03/21 2235 11/04/21 0220 11/04/21 1047  PHART  --  7.03*  --   PCO2ART  --  <18*  --   PO2ART  --  116*  --   HCO3 4.0* 3.4* 10.3*  O2SAT 88.4 99.9 82.1    CBC Recent Labs  Lab 11/03/21 1631 11/04/21 0335 11/04/21 0756  HGB 12.7 10.2* 10.8*  HCT 37.9 31.7* 32.9*  WBC 9.6 19.1* 16.7*  PLT 238 256 254    COAGULATION Recent Labs  Lab 11/03/21 1651  INR 0.9    CARDIAC  No results for input(s): "TROPONINI" in the last 168 hours. No results for input(s): "PROBNP" in the last 168 hours.   CHEMISTRY Recent Labs  Lab 11/03/21 1631 11/04/21 0035 11/04/21 0335 11/04/21 0714 11/04/21 0756 11/04/21 1246  NA 128* 130* 127*  --  128* 128*  K 4.1 4.6 3.4*  --  4.0 3.4*  CL 90* 92* 80*  --  92* 93*  CO2 11* <7* 18*  --  9* 13*  GLUCOSE 321* 374* 252*  --  195* 196*  BUN 72* 67* 57*  --  63* 62*  CREATININE 4.82* 4.97* 4.19*  --   4.90* 4.50*  CALCIUM 8.8* 8.5* 6.7*  --  7.7* 7.0*  MG  --   --  2.2 2.3  --   --   PHOS  --   --   --  5.5*  --   --    Estimated Creatinine Clearance: 14.5 mL/min (A) (by C-G formula based on SCr of 4.5 mg/dL (H)).   LIVER Recent Labs  Lab 11/03/21 1631 11/03/21 1651 11/04/21 0335 11/04/21 0756  AST 30  --  21 24  ALT 25  --  17 17  ALKPHOS 66  --  51 56  BILITOT 0.5  --  1.3* 1.6*  PROT 7.0  --  4.8* 5.2*  ALBUMIN 3.0*  --  2.0* 2.1*  INR  --  0.9  --   --      INFECTIOUS Recent Labs  Lab 11/04/21 0035 11/04/21 0446 11/04/21 0714 11/04/21 1246  LATICACIDVEN 8.1* 5.2*  --  2.8*  PROCALCITON  --   --  24.47  --      ENDOCRINE CBG (last 3)  Recent Labs    11/04/21 1324 11/04/21 1434 11/04/21 1542  GLUCAP 206* 201* 194*         IMAGING x48h  -  image(s) personally visualized  -   highlighted in bold ECHOCARDIOGRAM COMPLETE  Result Date: 11/04/2021    ECHOCARDIOGRAM REPORT   Patient Name:   Jody Ball Date of Exam: 11/04/2021 Medical Rec #:  [id number]    Height:       65.0 in Accession #:    [id number]   Weight:       156.7 lb Date of Birth:  [birth year]     BSA:          1.784 m Patient Age:    51 years     BP:           121/66 mmHg Patient Gender: F            HR:           86 bpm. Exam Location:  Inpatient Procedure: 2D Echo, Cardiac Doppler and Color Doppler Indications:     diabetic ketoacidosis  History:         Patient has prior history of Echocardiogram examinations, most                  recent 04/22/2009. Signs/Symptoms:Chest pain - non specifiic;                  Risk Factors:Hypertension, Diabetes and Dyslipidemia.  Sonographer:     Leta Jungling RDCS Referring Phys:  [id number] [facility] [name] Diagnosing Phys: Tessa Lerner DO  Sonographer Comments: Suboptimal apical window. Limited movement due to left central line. IMPRESSIONS  1. Left ventricular ejection fraction, by estimation, is 60 to 65%. The left ventricle has normal function. The left ventricle has  no regional wall motion abnormalities. Left ventricular diastolic parameters were normal.  2. Right ventricular systolic function is normal. The right ventricular size is normal. There is normal pulmonary artery systolic pressure.  3. The mitral valve is normal in structure. Mild mitral valve regurgitation. No evidence of mitral stenosis.  4. The aortic valve is tricuspid. Aortic valve regurgitation is not visualized. No aortic stenosis is present.  5. The inferior vena cava is dilated in size with >50% respiratory variability, suggesting right atrial pressure of 8 mmHg.  6. Rhythm strip during this exam demostrated normal sinus rhythm. Comparison(s): No prior Echocardiogram. FINDINGS  Left Ventricle: Left ventricular ejection fraction, by estimation, is 60 to 65%. The left ventricle has normal function. The left ventricle has no regional wall motion abnormalities. The left ventricular internal cavity size was normal in size. There is  no left ventricular hypertrophy. Left ventricular diastolic parameters were normal. Right Ventricle: The right ventricular size is normal. No increase in right ventricular wall thickness. Right ventricular systolic function is normal. There is normal pulmonary artery systolic pressure. The tricuspid regurgitant velocity is 2.08 m/s, and  with an assumed right atrial pressure of 3 mmHg, the estimated right ventricular systolic pressure is 20.3 mmHg. Left Atrium: Left atrial size was normal in size. Right Atrium: Right atrial size was normal in size. Pericardium: There is no evidence of pericardial effusion. Mitral Valve: The mitral valve is normal in structure. Mild mitral valve regurgitation. No evidence of mitral valve stenosis. Tricuspid Valve: The tricuspid valve is normal in structure. Tricuspid valve regurgitation is mild . No evidence of tricuspid stenosis. Aortic Valve: The aortic valve is tricuspid. Aortic valve regurgitation is not visualized. No aortic stenosis is present.  Pulmonic Valve: The pulmonic valve was not well visualized. Pulmonic valve regurgitation is not visualized. No evidence of pulmonic stenosis. Aorta: The aortic root is normal  in size and structure and the aortic root and ascending aorta are structurally normal, with no evidence of dilitation. Venous: The inferior vena cava is dilated in size with greater than 50% respiratory variability, suggesting right atrial pressure of 8 mmHg. IAS/Shunts: The interatrial septum was not well visualized. EKG: Rhythm strip during this exam demostrated normal sinus rhythm.  LEFT VENTRICLE PLAX 2D LVIDd:         4.10 cm   Diastology LVIDs:         2.70 cm   LV e' medial:    8.05 cm/s LV PW:         0.90 cm   LV E/e' medial:  13.4 LV IVS:        0.90 cm   LV e' lateral:   10.90 cm/s LVOT diam:     2.00 cm   LV E/e' lateral: 9.9 LV SV:         52 LV SV Index:   29 LVOT Area:     3.14 cm  RIGHT VENTRICLE             IVC RV S prime:     14.60 cm/s  IVC diam: 2.21 cm TAPSE (M-mode): 2.7 cm LEFT ATRIUM             Index        RIGHT ATRIUM           Index LA diam:        3.00 cm 1.68 cm/m   RA Area:     12.20 cm LA Vol (A2C):   27.9 ml 15.64 ml/m  RA Volume:   25.30 ml  14.19 ml/m LA Vol (A4C):   27.4 ml 15.36 ml/m LA Biplane Vol: 29.6 ml 16.60 ml/m  AORTIC VALVE LVOT Vmax:   85.50 cm/s LVOT Vmean:  55.700 cm/s LVOT VTI:    0.167 m  AORTA Ao Root diam: 2.90 cm Ao Asc diam:  2.80 cm MITRAL VALVE                TRICUSPID VALVE MV Area (PHT): 5.31 cm     TR Peak grad:   17.3 mmHg MV Decel Time: 143 msec     TR Vmax:        208.00 cm/s MV E velocity: 108.00 cm/s MV A velocity: 62.10 cm/s   SHUNTS MV E/A ratio:  1.74         Systemic VTI:  0.17 m                             Systemic Diam: 2.00 cm Sunit Tolia DO Electronically signed by Tessa Lerner DO Signature Date/Time: 11/04/2021/10:54:11 AM    Final    US RENAL  Result Date: 11/04/2021 CLINICAL DATA:  AK I EXAM: RENAL / URINARY TRACT ULTRASOUND COMPLETE COMPARISON:  CT abdomen and  pelvis dated November 03, 2021 FINDINGS: Right Kidney: Renal measurements: 11.6 x 5.1 x 5.6 cm = volume: 735 mL. Echogenicity within normal limits. No mass or hydronephrosis visualized. Left Kidney: Renal measurements: 11.4 x 5.1 x 5.1 cm = volume: 156 mL. Echogenicity within normal limits. No mass or hydronephrosis visualized. Bladder: Appears normal for degree of bladder distention. Bilateral jets visualized. Other: None. IMPRESSION: No hydronephrosis or nephrolithiasis. Electronically Signed   By: Allegra Lai M.D.   On: 11/04/2021 08:53   DG Chest Portable 1 View  Result Date: 11/04/2021 CLINICAL DATA:  51 year old female  line placement. EXAM: PORTABLE CHEST 1 VIEW COMPARISON:  Portable chest 11/03/2021. FINDINGS: Portable AP semi upright view at 0430 hours. Left IJ approach central line placed, tip is at the cavoatrial junction level. Mildly lower lung volumes. No pneumothorax. Normal cardiac size and mediastinal contours. Visualized tracheal air column is within normal limits. Allowing for portable technique the lungs are clear. No acute osseous abnormality identified. IMPRESSION: 1. Left IJ central line placed, tip at the cavoatrial junction level and no adverse features. 2. No acute cardiopulmonary abnormality. Electronically Signed   By: Odessa Fleming M.D.   On: 11/04/2021 04:56   CT ABDOMEN PELVIS WO CONTRAST  Result Date: 11/03/2021 CLINICAL DATA:  Urinary tract infection. Fever. Hypotension. Change in bowel habits. EXAM: CT ABDOMEN AND PELVIS WITHOUT CONTRAST TECHNIQUE: Multidetector CT imaging of the abdomen and pelvis was performed following the standard protocol without IV contrast. RADIATION DOSE REDUCTION: This exam was performed according to the departmental dose-optimization program which includes automated exposure control, adjustment of the mA and/or kV according to patient size and/or use of iterative reconstruction technique. COMPARISON:  None Available. FINDINGS: Lower chest: No acute  findings. Hepatobiliary: No mass visualized on this unenhanced exam. Contrast material is noted within the gallbladder which is otherwise unremarkable in appearance. No evidence of biliary ductal dilatation. Pancreas: No mass or inflammatory process visualized on this unenhanced exam. Spleen:  Within normal limits in size. Adrenals/Urinary tract: Right renal swelling is seen with numerous areas of decreased parenchymal enhancement and mild perinephric soft tissue stranding. These findings are consistent with severe pyelonephritis. No renal abscess identified. No evidence of ureteral calculi or hydronephrosis. Unremarkable urinary bladder. Stomach/Bowel: No evidence of obstruction, inflammatory process, or abnormal fluid collections. Vascular/Lymphatic: No pathologically enlarged lymph nodes identified. No evidence of abdominal aortic aneurysm. Reproductive:  No mass or other significant abnormality. Other:  None. Musculoskeletal:  No suspicious bone lesions identified. IMPRESSION: Severe right pyelonephritis. No evidence of renal abscess, ureteral calculus, or hydronephrosis. Electronically Signed   By: Danae Orleans M.D.   On: 11/03/2021 18:54   DG Chest Port 1 View  Result Date: 11/03/2021 CLINICAL DATA:  Possible sepsis EXAM: PORTABLE CHEST 1 VIEW COMPARISON:  09/24/2017 FINDINGS: The heart size and mediastinal contours are within normal limits. Both lungs are clear. The visualized skeletal structures are unremarkable. IMPRESSION: No active disease. Electronically Signed   By: Ernie Avena M.D.   On: 11/03/2021 16:51

## 2021-11-04 NOTE — Inpatient Diabetes Management (Signed)
Inpatient Diabetes Program Recommendations  AACE/ADA: New Consensus Statement on Inpatient Glycemic Control (2015)  Target Ranges:  Prepandial:   less than 140 mg/dL      Peak postprandial:   less than 180 mg/dL (1-2 hours)      Critically ill patients:  140 - 180 mg/dL   Lab Results  Component Value Date   GLUCAP 201 (H) 11/04/2021   HGBA1C 7.8 (H) 11/04/2021    Review of Glycemic Control  Diabetes history: DM2 Outpatient Diabetes medications: Janumet 50-1 gm. bid Current orders for Inpatient glycemic control: IV insulin  Inpatient Diabetes Program Recommendations:   Noted patient admitted in DKA on Janumet. Will follow and assist as needed.  Thank you, Jody Ball. Jody Beezley, RN, MSN, CDE  Diabetes Coordinator Inpatient Glycemic Control Team Team Pager [phone] (8am-5pm) 11/04/2021 8:52 AM

## 2021-11-04 NOTE — Progress Notes (Signed)
A central line has been placed for pressors.

## 2021-11-04 NOTE — Progress Notes (Signed)
Troponin 2495 results called to Dr. Marchelle Gearing. Repeat EKG showed sinus tachycardia. Dr. Marchelle Gearing requested that troponins be checked with tonights labs. Orders alread in to check two more times.

## 2021-11-04 NOTE — Procedures (Signed)
Central Venous Catheter Insertion Procedure Note  Jody Ball  [id number]  06/25/1970  Date:11/04/21  Time:4:13 AM   Provider Performing:Adit Riddles Gaynell Face   Procedure: Insertion of Non-tunneled Central Venous [phone]) with US guidance (29937)   Indication(s) Medication administration  Consent Risks of the procedure as well as the alternatives and risks of each were explained to the patient and/or caregiver.  Consent for the procedure was obtained and is signed in the bedside chart  Anesthesia Topical only with 1% lidocaine   Timeout Verified patient identification, verified procedure, site/side was marked, verified correct patient position, special equipment/implants available, medications/allergies/relevant history reviewed, required imaging and test results available.  Sterile Technique Maximal sterile technique including full sterile barrier drape, hand hygiene, sterile gown, sterile gloves, mask, hair covering, sterile ultrasound probe cover (if used).  Procedure Description Area of catheter insertion was cleaned with chlorhexidine and draped in sterile fashion.  With real-time ultrasound guidance a central venous catheter was placed into the left internal jugular vein. Nonpulsatile blood flow and easy flushing noted in all ports.  The catheter was sutured in place and sterile dressing applied.  Complications/Tolerance None; patient tolerated the procedure well. Chest X-ray is ordered to verify placement for internal jugular or subclavian cannulation.   Chest x-ray is not ordered for femoral cannulation.  EBL Minimal  Specimen(s) None   Line was done under u/s guidance. Easily compressible vein noted with neighboring pulsatile artery. Upon stick dark red non pulsatile blood was noted. Wire easily advanced. Wire was verified to be in easily compressible/non pulsatile vessel in both cross sectional and longitudinal views under ultrasound guidance. Skin was easily  dilated and catheter advanced without issue. Wire was withdrawn from vessel. No air was aspirated thru entirety of the procedure. Pt tolerated well. No complications were appreciated.

## 2021-11-04 NOTE — Progress Notes (Signed)
ON-CALL CARDIOLOGY 11/04/21  Patient's name: Jody Ball.   MRN: [id number].    DOB: [birth year] Primary care provider: [facility] (Inactive). Primary cardiologist: NA  Interaction regarding this patient's care today: Was called by Dr. Marchelle Gearing regarding Ms. Corredor having CP and elevated hs troponin. The CP is quite non-specific and not suggestive of anginal discomfort. However, she has multiple cardiovascular risk factors including DM.   She present to hospital elevated glucose levels and fevers.   She is currently being treated for right pyelonephritis with urinary tract obstruction, septic shock requiring pressor support, acute kidney injury with serum creatinine level of 4.9 mg/dL, diabetic ketoacidosis, Lactic acidosis peak 8.1, recent pH of 7.03, multiple electrolyte abnormalities.  EKG obtained at the time of chest discomfort notes normal sinus rhythm without underlying ischemia or injury pattern.    LABORATORY DATA: ABG    Component Value Date/Time   PHART 7.03 (LL) 11/04/2021 0220   PCO2ART <18 (LL) 11/04/2021 0220   PO2ART 116 (H) 11/04/2021 0220   HCO3 10.3 (L) 11/04/2021 1047   ACIDBASEDEF 15.0 (H) 11/04/2021 1047   O2SAT 82.1 11/04/2021 1047      Latest Ref Rng & Units 11/04/2021    7:56 AM 11/04/2021    3:35 AM 11/03/2021    4:31 PM  CBC  WBC 4.0 - 10.5 K/uL 16.7  19.1  9.6   Hemoglobin 12.0 - 15.0 g/dL 75.1  02.5  85.2   Hematocrit 36.0 - 46.0 % 32.9  31.7  37.9   Platelets 150 - 400 K/uL 254  256  238        Latest Ref Rng & Units 11/04/2021   12:46 PM 11/04/2021    7:56 AM 11/04/2021    3:35 AM  CMP  Glucose 70 - 99 mg/dL 778  242  353   BUN 6 - 20 mg/dL 62  63  57   Creatinine 0.44 - 1.00 mg/dL 6.14  4.31  5.40   Sodium 135 - 145 mmol/L 128  128  127   Potassium 3.5 - 5.1 mmol/L 3.4  4.0  3.4   Chloride 98 - 111 mmol/L 93  92  80   CO2 22 - 32 mmol/L 13  9  18    Calcium 8.9 - 10.3 mg/dL 7.0  7.7  6.7   Total Protein 6.5 -  8.1 g/dL  5.2  4.8   Total Bilirubin 0.3 - 1.2 mg/dL  1.6  1.3   Alkaline Phos 38 - 126 U/L  56  51   AST 15 - 41 U/L  24  21   ALT 0 - 44 U/L  17  17    BMP Recent Labs    11/04/21 0335 11/04/21 0756 11/04/21 1246  NA 127* 128* 128*  K 3.4* 4.0 3.4*  CL 80* 92* 93*  CO2 18* 9* 13*  GLUCOSE 252* 195* 196*  BUN 57* 63* 62*  CREATININE 4.19* 4.90* 4.50*  CALCIUM 6.7* 7.7* 7.0*  GFRNONAA 12* 10* 11*   Estimated Creatinine Clearance: 14.5 mL/min (A) (by C-G formula based on SCr of 4.5 mg/dL (H)).  Lab Results  Component Value Date   HGBA1C 7.8 (H) 11/04/2021    Lactic Acid, Venous    Component Value Date/Time   LATICACIDVEN 2.8 (HH) 11/04/2021 1246   LATICACIDVEN 5.2 (HH) 11/04/2021 0446   LATICACIDVEN 8.1 (HH) 11/04/2021 0035   Cardiac Panel (last 3 results) Recent Labs    11/04/21 0714  TROPONINIHS 655*  Hepatic Function Panel Recent Labs    11/03/21 1631 11/04/21 0335 11/04/21 0756  PROT 7.0 4.8* 5.2*  ALBUMIN 3.0* 2.0* 2.1*  AST 30 21 24   ALT 25 17 17   ALKPHOS 66 51 56  BILITOT 0.5 1.3* 1.6*    Echocardiogram was also performed which notes preserved LVEF and no obvious regional wall motion abnormalities.  Impression: Elevated troponins likely secondary to supply demand ischemia in the setting of septic shock, acute kidney injury, acidosis, pH of 7.03, and multiple electrolyte abnormalities  Precordial pain -not suggestive of anginal discomfort based on symptoms  Septic shock secondary to pyelonephritis/UTI requiring vasopressor support  Diabetic ketoacidosis  Lactic acidosis on presentation   Recommendations: Spoke to critical care medicine provider Dr. regarding the patient's case and objective findings.  She complains of nonspecific precordial pain.  Her high sensitive troponins are elevated likely due to reasons mentioned above.  EKG shows sinus rhythm without underlying ischemia or injury pattern.  And surface echocardiogram  performed this morning notes preserved LVEF without regional wall motion abnormalities or severe valvular heart disease.  I shared the same concern with Dr. and that she may have underlying CAD given her other cardiovascular comorbidities.  However, given the fact that the EKG does not illustrate ACS findings, discomfort not suggestive of angina pectoris, and LVEF is preserved I would recommend treating underlying condition prior to considering ischemic work-up (i.e. coronary CTA/angiography) as these modalities will expose the patient to contrast use which most likely worsen her renal function when her serum creatinine is already 4.9 mg/dL.  If she develops anginal discomfort or ECG findings to suggest ACS a more expedited ischemic evaluation can be considered after discussing the risks and benefits.  IF she has anginal discomfort / ischemic ECG consider starting IV heparin per ACS protocol after evaluating her bleeding risk.   Otherwise would recommend treating her underlying sepsis, lactic acidosis, DKA, electrolyte abnormalities for now.  We can discuss the timing of her ischemic work-up as she gets closer to discharge depending on her symptoms and clinical trajectory during this hospitalization.  Dr. Marchelle Gearing agrees w/ these recommendations. A formal consultation is not requested at this time.  But feel free to reach out if there is any change in clinical status.  Telephone encounter total time: 20 minutes.   Marchelle Gearing, Marchelle Gearing, [facility]  Pager: [phone] Office: [phone]

## 2021-11-04 NOTE — Progress Notes (Signed)
eLink Physician-Brief Progress Note Patient Name: Jody Ball DOB: 1970/09/07 MRN: [id number]   Date of Service  11/04/2021  HPI/Events of Note  Lab results check, lactic acid trending down, currently 5.2.  eICU Interventions  Continue current Rx.        Thomasene Lot [name] 11/04/2021, 5:52 AM

## 2021-11-04 NOTE — Progress Notes (Signed)
[facility]  Patient: Jody Ball GEZ:[id number]   PCP: [facility] (Inactive) DOB: 04-08-1971   DOA: 11/03/2021   DOS: 11/04/2021    Patient's chart was screened.  Appreciate PCCM  assistance. Patient's care is currently being transferred to [facility] service. [facility] hospitalist will sign off. [facility] hospitalist will be happy to take the pt back when the patient is ready for transfer.  Author: Lynden Oxford, MD [facility] 11/04/2021 7:59 AM   If 7PM-7AM, please contact night-coverage at [email or web address]

## 2021-11-04 NOTE — Progress Notes (Signed)
  Echocardiogram 2D Echocardiogram has been performed.  Leta Jungling M 11/04/2021, 9:05 AM

## 2021-11-04 NOTE — Consult Note (Signed)
NAME:  Jody Ball, MRN:  [id number], DOB:  January 18, 1971, LOS: 1 ADMISSION DATE:  11/03/2021, CONSULTATION DATE:  11/04/21 REFERRING MD:  Dr Martyn Malay, CHIEF COMPLAINT:  shock   History of Present Illness:  51 yo female with pmh T2DM, HTN, hyperlipidemia p resented to [facility] with malaise and fever. Pt has recently been treated as an outpt for pyelonephritis and UTI. She was reportedly give 1gm ctx IM and oral abx and sent home on Tuesday. She presented Thursday evening with escalating fevers and lack of improvement. She c/o R flank pain and abdominal pain, moderate to severe in intensity, radiating down to groin. She endorses this is worse with movement and improved with rest. + N no vomiting. Denies diarrhea. No dizziness/sob no other associated symptoms. Has been compliant with her medications despite decreased po intake and minimal urine production as an outpt. Constipation x1 week as well.    Pertinent  Medical History  HTN, Hyperlipidemia, T2DM   Significant Hospital Events: Including procedures, antibiotic start and stop dates in addition to other pertinent events   6/16: admitted to [facility] for pyelonephritis and DKA progressed to shock now on pressors and transferring to ICU 6/17: CCM consulted  Interim History / Subjective:    Objective   Blood pressure 93/61, pulse (!) 119, temperature (!) 96.6 F (35.9 C), temperature source Axillary, resp. rate (!) 23, height 5\' 5"  (1.651 m), weight 71.1 kg, last menstrual period 10/19/2021, SpO2 99 %.        Intake/Output Summary (Last 24 hours) at 11/04/2021 0255 Last data filed at 11/04/2021 0200 Gross per 24 hour  Intake 5366.49 ml  Output --  Net 5366.49 ml   Filed Weights   11/03/21 1559 11/04/21 0033  Weight: 66.7 kg 71.1 kg    Examination: General: aaox3 but overall appears ill and tired HENT: ncat, eomi, mmp but very dry Lungs: ctab Cardiovascular: reg rhythm, tachycardic Abdomen: ttp but no rebound or guarding, bs  diminished Extremities: no c/c/e Neuro: no focal deficits GU: deferred  Resolved Hospital Problem list     Assessment & Plan:  Shock 2/2 sepsis 2/2 pyelonephritis and uti:  -no obstruction seen on ct imaging.  -titrate pressors to map >65 -will attempt to help support with addition of bicarb gtt as well to improve pH >7 -add vaso -place central line and possibly arterial line if unable to obtain adequate cuff readings.  -agree with zosyn, she was given a dose of vanc this evening as well.  -f/u cx, ideally will be able to de-escalate these abx or at least stop vanc in light of high risk for worsening renal compromise.  -follow lactates.  -echo in am as well if BP not improving.   AKI:  -2/2 above -she has received 5L IVF, will continue with bicarb at this time.  -may not avoid dialysis if cont to worsen without urinary output -hold nephrotoxic meds -follow strict I/O  Metabolic acidosis:  -multifactorial with DKA, lactate, metformin use and sepsis -bicarb for now, hopefully the abx will treat the infection and metformin will metabolize and she will improve wihtout need for dialysis  T2DM with hyperglycemia: DKA:  -hold home agents -treat for  dka via protocol, BHA >8 -a1c pending -insulin infusion -will utilize bicarb in d5 once bs appropriate for the change to d5 fluids in lieu of d5 LR and bicarb (at 11ml/hr) -will decrease LR rate to 84ml/hr for now.  -hopefully pH will improve with continued treatment and will be able to transition  off bicarb infusion back to protocol  H/o Hypertension:  -hgolding home meds  Hyperlipidemia:  -holding statin  Best Practice (right click and "Reselect all SmartList Selections" daily)   Diet/type: NPO DVT prophylaxis: prophylactic heparin  GI prophylaxis: N/A Lines: Central line Foley:  Yes, and it is still needed Code Status:  full code Last date of multidisciplinary goals of care discussion [11/04/21 with pt and husband at  bedside. ]  Labs   CBC: Recent Labs  Lab 11/03/21 1631  WBC 9.6  HGB 12.7  HCT 37.9  MCV 96.9  PLT 238    Basic Metabolic Panel: Recent Labs  Lab 11/03/21 1631 11/04/21 0035  NA 128* 130*  K 4.1 4.6  CL 90* 92*  CO2 11* <7*  GLUCOSE 321* 374*  BUN 72* 67*  CREATININE 4.82* 4.97*  CALCIUM 8.8* 8.5*   GFR: Estimated Creatinine Clearance: 13.1 mL/min (A) (by C-G formula based on SCr of 4.97 mg/dL (H)). Recent Labs  Lab 11/03/21 1631 11/03/21 1651 11/03/21 1800 11/03/21 2118 11/04/21 0035  WBC 9.6  --   --   --   --   LATICACIDVEN  --  5.2* 5.4* 7.1* 8.1*    Liver Function Tests: Recent Labs  Lab 11/03/21 1631  AST 30  ALT 25  ALKPHOS 66  BILITOT 0.5  PROT 7.0  ALBUMIN 3.0*   Recent Labs  Lab 11/03/21 1631  LIPASE 26   No results for input(s): "AMMONIA" in the last 168 hours.  ABG    Component Value Date/Time   PHART 7.03 (LL) 11/04/2021 0220   PCO2ART <18 (LL) 11/04/2021 0220   PO2ART 116 (H) 11/04/2021 0220   HCO3 3.4 (L) 11/04/2021 0220   ACIDBASEDEF 25.7 (H) 11/04/2021 0220   O2SAT 99.9 11/04/2021 0220     Coagulation Profile: Recent Labs  Lab 11/03/21 1651  INR 0.9    Cardiac Enzymes: No results for input(s): "CKTOTAL", "CKMB", "CKMBINDEX", "TROPONINI" in the last 168 hours.  HbA1C: No results found for: "HGBA1C"  CBG: Recent Labs  Lab 11/03/21 1550 11/03/21 2154 11/04/21 0036 11/04/21 0138 11/04/21 0235  GLUCAP 317* 328* 335* 364* 313*    Review of Systems:   As per HPI  Past Medical History:  She,  has a past medical history of Diabetes mellitus without complication (HCC), High cholesterol, and Hypertension.   Surgical History:   Past Surgical History:  Procedure Laterality Date   BUNIONECTOMY Left    VARICOSE VEIN SURGERY Bilateral      Social History:   reports that she has never smoked. She has never used smokeless tobacco. She reports current alcohol use. She reports that she does not use drugs.    Family History:  Her family history is negative for Heart disease.   Allergies No Known Allergies   Home Medications  Prior to Admission medications   Medication Sig Start Date End Date Taking? Authorizing Provider  acetaminophen (TYLENOL) 500 MG tablet Take 1,000 mg by mouth every evening.   Yes [provider]  ASHWAGANDHA PO Take 1 tablet by mouth daily.   Yes [provider]  atorvastatin (LIPITOR) 20 MG tablet Take 20 mg by mouth every evening. 09/07/17  Yes [provider]  busPIRone (BUSPAR) 5 MG tablet Take 5 mg by mouth 2 (two) times daily as needed (anxiety). 12/01/20  Yes [provider]  ciprofloxacin (CIPRO) 500 MG tablet Take 500 mg by mouth 2 (two) times daily. 11/02/21  Yes [provider]  fluticasone Aleda Grana)  50 MCG/ACT nasal spray Place 2 sprays into both nostrils 2 (two) times daily as needed for allergies or rhinitis.  09/10/17  Yes [provider]  hydrochlorothiazide (HYDRODIURIL) 25 MG tablet Take 25 mg by mouth daily. 10/06/21  Yes [provider]  JANUMET 50-1000 MG tablet Take 1 tablet by mouth 2 (two) times daily. 08/10/21  Yes [provider]  lisinopril (ZESTRIL) 20 MG tablet Take 20 mg by mouth every evening. 10/16/21  Yes [provider]  ondansetron (ZOFRAN) 4 MG tablet Take 4 mg by mouth every 8 (eight) hours as needed for nausea or vomiting. 10/31/21  Yes [provider]  SPRINTEC 28 0.25-35 MG-MCG tablet Take 1 tablet by mouth daily. 09/10/17  Yes [provider]  lisinopril (PRINIVIL,ZESTRIL) 5 MG tablet Take 2 tablets (10 mg total) by mouth every evening. Patient not taking: Reported on 11/03/2021 09/24/17   Mancel Bale, MD  LORazepam (ATIVAN) 0.5 MG tablet Take 2 tablets (1 mg total) by mouth every 6 (six) hours as needed for anxiety. Patient not taking: Reported on 11/03/2021 09/24/17   Mancel Bale, MD     43 min critical care time between 0200-0700  excluding procedures.

## 2021-11-04 NOTE — Progress Notes (Signed)
Nurse tech reporting that patient is having vaginal bleeding when being wiped. Patient does not report being on her period. Dr. Marchelle Gearing notified with orders received to stop Heparin and start SCDs.

## 2021-11-04 NOTE — Consult Note (Addendum)
NAME:  Jody Ball, MRN:  [id number], DOB:  Jul 07, 1970, LOS: 1 ADMISSION DATE:  11/03/2021, CONSULTATION DATE:  11/04/21 REFERRING MD:  Dr Martyn Malay, CHIEF COMPLAINT:  shock   BRIEF  51 yo female with pmh T2DM, HTN, hyperlipidemia p resented to [facility] with malaise and fever. Pt has recently been treated as an outpt for pyelonephritis and UTI. She was reportedly give 1gm ctx IM and oral abx and sent home on Tuesday. She presented Thursday evening with escalating fevers and lack of improvement. She c/o R flank pain and abdominal pain, moderate to severe in intensity, radiating down to groin. She endorses this is worse with movement and improved with rest. + N no vomiting. Denies diarrhea. No dizziness/sob no other associated symptoms. Has been compliant with her medications despite decreased po intake and minimal urine production as an outpt. Constipation x1 week as well.    Pertinent  Medical History  HTN, Hyperlipidemia, T2DM   Significant Hospital Events: Including procedures, antibiotic start and stop dates in addition to other pertinent events   6/16: admitted to [facility] for pyelonephritis and DKA progressed to shock now on pressors and transferring to ICU 6/17: CCM consulted  Interim History / Subjective:   6/17 am rounds -: She is on room air.  She is on insulin drip.  She is currently at 5 mcg.  She is on antibiotics.  Afebrile with white count 19.1.  Cultures negative so far.  Lactic acid is improved but very poor clearance (from 7-> 5]. Bic improvd to 18.  Her husband at the bedside.  She says she feels and thirsty.  She also has some nonspecific chest discomfort after central line placement.  She does not feel hungry but does feel thirsty.  She also reports that her mouth is sore.  No tmaking urine - 100cc on blandder scan 2h ago    Objective   Blood pressure (!) 107/54, pulse 91, temperature (!) 97.5 F (36.4 C), temperature source Oral, resp. rate 16, height 5\' 5"  (1.651 m), weight 71.1  kg, last menstrual period 10/19/2021, SpO2 99 %.        Intake/Output Summary (Last 24 hours) at 11/04/2021 0707 Last data filed at 11/04/2021 0645 Gross per 24 hour  Intake 6720.6 ml  Output --  Net 6720.6 ml   Filed Weights   11/03/21 1559 11/04/21 0033  Weight: 66.7 kg 71.1 kg    General Appearance:  Looks ok. Lying in bed flat.  Head:  Normocephalic, without obvious abnormality, atraumatic Eyes:  PERRL - yes, conjunctiva/corneas - clear     Ears:  Normal external ear canals, both ears Nose:  G tube - no Throat:  ETT TUBE - no , OG tube - no Neck:  Supple,  No enlargement/tenderness/nodules. LEFT IJ CVL + Lungs: Clear to auscultation bilaterally,  Heart:  S1 and S2 normal, no murmur, CVP - x.  Pressors - LEVEOPHED Abdomen:  Soft, no masses, no organomegaly Genitalia / Rectal:  Not done Extremities:  Extremities- intact Skin:  ntact in exposed areas . Sacral area - not examined Neurologic:  Sedation - none -> RASS - +! 11/06/21 Moves all 4s - yes. CAM-ICU - neg . Orientation - x3     Resolved Hospital Problem list     Assessment & Plan:  Severe Right pyelonephritis UTI obstruction - Present on Admit  11/04/2021: No fever  Plan  - Antibiotics as below Anti-infectives (From admission, onward)    Start     Dose/Rate Route Frequency  Ordered Stop   11/04/21 0300  vancomycin (VANCOREADY) IVPB 1500 mg/300 mL        1,500 mg 150 mL/hr over 120 Minutes Intravenous  Once 11/04/21 0200 11/04/21 0445   11/04/21 0200  vancomycin variable dose per unstable renal function (pharmacist dosing)  Status:  Discontinued         Does not apply See admin instructions 11/04/21 0200 11/04/21 0737   11/03/21 2200  piperacillin-tazobactam (ZOSYN) IVPB 2.25 g        2.25 g 100 mL/hr over 30 Minutes Intravenous Every 8 hours 11/03/21 2112     11/03/21 1915  cefTRIAXone (ROCEPHIN) 2 g in sodium chloride 0.9 % 100 mL IVPB  Status:  Discontinued        2 g 200 mL/hr over 30 Minutes Intravenous Every  24 hours 11/03/21 1907 11/03/21 2047        Shock 2/2 sepsis 2/2 pyelonephritis and uti: without o obstruction seen on ct imaging.  - Present on Admit  11/04/2021 - still on levophed via Left IJ CVL c/o being dry  PLAN  - 2 more liter fluid bolus  - levophed forr  map >65     AKI:  2/2 above - Present on Admit  11/04/2021: Creatinine improved marginally only.  100 cc urine in the bladder.  This is for at least 5 L fluid bolus so far.  Still complains of dehydration  Plan  - Fluid bolus 2 L and continue to monitor closely - fllow strict I/O -Avoid nephrotoxic medications -Hopefully can avoid dialysis - check renal ultrasound  Metabolic acidosis: Present on Admit : -multifactorial with DKA, lactate, metformin use and sepsis  11/04/21: Mild improvement in lactic acidosis to 5 few hours ago  Plan -Bicarb infusion to continue -Recheck VBG and lactic acidosis -Hopefully can avoid dialysis   T2DM with hyperglycemia: DKA:  - Present on Admit  11/17/2021: Bicarb now 18.  Last beta hydroxybutyrate acid check was 7 hours ago.  Plan -hold home agents -Continue treat for  dka via protocol, BHA >8 -a1c pending -insulin infusion -will utilize bicarb in d5 once bs appropriate for the change to d5 fluids in lieu of d5 LR and bicarb (at 142ml/hr) -will decrease LR rate to 4ml/hr for now.  -hopefully pH will improve with continued treatment and will be able to transition off bicarb infusion back to protocol  Chest pain - non specifiic  6/17 - cxr clear  Plan - check trop - check ekg - check echo  H/o Hypertension:  -hgolding home meds  Hyperlipidemia:  -holding statin  Best Practice (right click and "Reselect all SmartList Selections" daily)   Diet/type: Offer clear liquid diet morning of 11/04/21 DVT prophylaxis: prophylactic heparin  GI prophylaxis: N/A Lines: Central line Foley:  Yes, and it is still needed Code Status:  full code Last date of  multidisciplinary goals of care discussion  - 11/04/21 with pt and husband at bedside - repeated in mroning rounds 11/04/21    ATTESTATION & SIGNATURE   The patient Jody Ball is critically ill with multiple organ systems failure and requires high complexity decision making for assessment and support, frequent evaluation and titration of therapies, application of advanced monitoring technologies and extensive interpretation of multiple databases.   Critical Care Time devoted to patient care services described in this note is  35  Minutes. This time reflects time of care of this signee Dr Kalman Shan. This critical care time does not reflect procedure time, or  teaching time or supervisory time of PA/NP/Med student/Med Resident etc but could involve care discussion time     Dr. Kalman Shan, M.D., [facility].C.P Pulmonary and Critical Care Medicine Medical Director - [facility] ICU Staff Physician, [facility] [place] [facility] Pager: [phone], If no answer or between  15:00h - 7:00h: call [phone]  11/04/2021 7:08 AM    LABS    PULMONARY Recent Labs  Lab 11/03/21 2235 11/04/21 0220  PHART  --  7.03*  PCO2ART  --  <18*  PO2ART  --  116*  HCO3 4.0* 3.4*  O2SAT 88.4 99.9    CBC Recent Labs  Lab 11/03/21 1631 11/04/21 0335  HGB 12.7 10.2*  HCT 37.9 31.7*  WBC 9.6 19.1*  PLT 238 256    COAGULATION Recent Labs  Lab 11/03/21 1651  INR 0.9    CARDIAC  No results for input(s): "TROPONINI" in the last 168 hours. No results for input(s): "PROBNP" in the last 168 hours.   CHEMISTRY Recent Labs  Lab 11/03/21 1631 11/04/21 0035 11/04/21 0335  NA 128* 130* 127*  K 4.1 4.6 3.4*  CL 90* 92* 80*  CO2 11* <7* 18*  GLUCOSE 321* 374* 252*  BUN 72* 67* 57*  CREATININE 4.82* 4.97* 4.19*  CALCIUM 8.8* 8.5* 6.7*  MG  --   --  2.2   Estimated Creatinine Clearance: 15.5 mL/min (A) (by C-G formula based on SCr of 4.19  mg/dL (H)).   LIVER Recent Labs  Lab 11/03/21 1631 11/03/21 1651 11/04/21 0335  AST 30  --  21  ALT 25  --  17  ALKPHOS 66  --  51  BILITOT 0.5  --  1.3*  PROT 7.0  --  4.8*  ALBUMIN 3.0*  --  2.0*  INR  --  0.9  --      INFECTIOUS Recent Labs  Lab 11/03/21 2118 11/04/21 0035 11/04/21 0446  LATICACIDVEN 7.1* 8.1* 5.2*     ENDOCRINE CBG (last 3)  Recent Labs    11/04/21 0442 11/04/21 0556 11/04/21 0657  GLUCAP 269* 219* 197*         IMAGING x48h  - image(s) personally visualized  -   highlighted in bold DG Chest Portable 1 View  Result Date: 11/04/2021 CLINICAL DATA:  51 year old female line placement. EXAM: PORTABLE CHEST 1 VIEW COMPARISON:  Portable chest 11/03/2021. FINDINGS: Portable AP semi upright view at 0430 hours. Left IJ approach central line placed, tip is at the cavoatrial junction level. Mildly lower lung volumes. No pneumothorax. Normal cardiac size and mediastinal contours. Visualized tracheal air column is within normal limits. Allowing for portable technique the lungs are clear. No acute osseous abnormality identified. IMPRESSION: 1. Left IJ central line placed, tip at the cavoatrial junction level and no adverse features. 2. No acute cardiopulmonary abnormality. Electronically Signed   By: Odessa Fleming M.D.   On: 11/04/2021 04:56   CT ABDOMEN PELVIS WO CONTRAST  Result Date: 11/03/2021 CLINICAL DATA:  Urinary tract infection. Fever. Hypotension. Change in bowel habits. EXAM: CT ABDOMEN AND PELVIS WITHOUT CONTRAST TECHNIQUE: Multidetector CT imaging of the abdomen and pelvis was performed following the standard protocol without IV contrast. RADIATION DOSE REDUCTION: This exam was performed according to the departmental dose-optimization program which includes automated exposure control, adjustment of the mA and/or kV according to patient size and/or use of iterative reconstruction technique. COMPARISON:  None Available. FINDINGS: Lower chest: No acute  findings.  Hepatobiliary: No mass visualized on this unenhanced exam. Contrast material is noted within the gallbladder which is otherwise unremarkable in appearance. No evidence of biliary ductal dilatation. Pancreas: No mass or inflammatory process visualized on this unenhanced exam. Spleen:  Within normal limits in size. Adrenals/Urinary tract: Right renal swelling is seen with numerous areas of decreased parenchymal enhancement and mild perinephric soft tissue stranding. These findings are consistent with severe pyelonephritis. No renal abscess identified. No evidence of ureteral calculi or hydronephrosis. Unremarkable urinary bladder. Stomach/Bowel: No evidence of obstruction, inflammatory process, or abnormal fluid collections. Vascular/Lymphatic: No pathologically enlarged lymph nodes identified. No evidence of abdominal aortic aneurysm. Reproductive:  No mass or other significant abnormality. Other:  None. Musculoskeletal:  No suspicious bone lesions identified. IMPRESSION: Severe right pyelonephritis. No evidence of renal abscess, ureteral calculus, or hydronephrosis. Electronically Signed   By: Danae Orleans M.D.   On: 11/03/2021 18:54   DG Chest Port 1 View  Result Date: 11/03/2021 CLINICAL DATA:  Possible sepsis EXAM: PORTABLE CHEST 1 VIEW COMPARISON:  09/24/2017 FINDINGS: The heart size and mediastinal contours are within normal limits. Both lungs are clear. The visualized skeletal structures are unremarkable. IMPRESSION: No active disease. Electronically Signed   By: Ernie Avena M.D.   On: 11/03/2021 16:51

## 2021-11-05 DIAGNOSIS — N390 Urinary tract infection, site not specified: Secondary | ICD-10-CM | POA: Diagnosis not present

## 2021-11-05 DIAGNOSIS — A419 Sepsis, unspecified organism: Secondary | ICD-10-CM | POA: Diagnosis not present

## 2021-11-05 DIAGNOSIS — R6521 Severe sepsis with septic shock: Secondary | ICD-10-CM | POA: Diagnosis not present

## 2021-11-05 LAB — GLUCOSE, CAPILLARY
Glucose-Capillary: 123 mg/dL — ABNORMAL HIGH (ref 70–99)
Glucose-Capillary: 140 mg/dL — ABNORMAL HIGH (ref 70–99)
Glucose-Capillary: 146 mg/dL — ABNORMAL HIGH (ref 70–99)
Glucose-Capillary: 152 mg/dL — ABNORMAL HIGH (ref 70–99)
Glucose-Capillary: 153 mg/dL — ABNORMAL HIGH (ref 70–99)
Glucose-Capillary: 155 mg/dL — ABNORMAL HIGH (ref 70–99)
Glucose-Capillary: 161 mg/dL — ABNORMAL HIGH (ref 70–99)
Glucose-Capillary: 162 mg/dL — ABNORMAL HIGH (ref 70–99)
Glucose-Capillary: 163 mg/dL — ABNORMAL HIGH (ref 70–99)
Glucose-Capillary: 173 mg/dL — ABNORMAL HIGH (ref 70–99)
Glucose-Capillary: 173 mg/dL — ABNORMAL HIGH (ref 70–99)
Glucose-Capillary: 214 mg/dL — ABNORMAL HIGH (ref 70–99)

## 2021-11-05 LAB — COMPREHENSIVE METABOLIC PANEL
ALT: 14 U/L (ref 0–44)
AST: 23 U/L (ref 15–41)
Albumin: 1.9 g/dL — ABNORMAL LOW (ref 3.5–5.0)
Alkaline Phosphatase: 44 U/L (ref 38–126)
Anion gap: 10 (ref 5–15)
BUN: 45 mg/dL — ABNORMAL HIGH (ref 6–20)
CO2: 26 mmol/L (ref 22–32)
Calcium: 7.6 mg/dL — ABNORMAL LOW (ref 8.9–10.3)
Chloride: 98 mmol/L (ref 98–111)
Creatinine, Ser: 2.69 mg/dL — ABNORMAL HIGH (ref 0.44–1.00)
GFR, Estimated: 21 mL/min — ABNORMAL LOW (ref >60)
Glucose, Bld: 156 mg/dL — ABNORMAL HIGH (ref 70–99)
Potassium: 3 mmol/L — ABNORMAL LOW (ref 3.5–5.1)
Sodium: 134 mmol/L — ABNORMAL LOW (ref 135–145)
Total Bilirubin: 0.5 mg/dL (ref 0.3–1.2)
Total Protein: 4.8 g/dL — ABNORMAL LOW (ref 6.5–8.1)

## 2021-11-05 LAB — BLOOD GAS, VENOUS
Acid-Base Excess: 3.3 mmol/L — ABNORMAL HIGH (ref 0.0–2.0)
Bicarbonate: 27.8 mmol/L (ref 20.0–28.0)
O2 Saturation: 74.5 %
Patient temperature: 37
pCO2, Ven: 41 mmHg — ABNORMAL LOW (ref 44–60)
pH, Ven: 7.44 — ABNORMAL HIGH (ref 7.25–7.43)
pO2, Ven: 41 mmHg (ref 32–45)

## 2021-11-05 LAB — BASIC METABOLIC PANEL
Anion gap: 9 (ref 5–15)
BUN: 33 mg/dL — ABNORMAL HIGH (ref 6–20)
CO2: 25 mmol/L (ref 22–32)
Calcium: 8.1 mg/dL — ABNORMAL LOW (ref 8.9–10.3)
Chloride: 101 mmol/L (ref 98–111)
Creatinine, Ser: 1.94 mg/dL — ABNORMAL HIGH (ref 0.44–1.00)
GFR, Estimated: 31 mL/min — ABNORMAL LOW (ref >60)
Glucose, Bld: 121 mg/dL — ABNORMAL HIGH (ref 70–99)
Potassium: 3.2 mmol/L — ABNORMAL LOW (ref 3.5–5.1)
Sodium: 135 mmol/L (ref 135–145)

## 2021-11-05 LAB — MAGNESIUM: Magnesium: 2.4 mg/dL (ref 1.7–2.4)

## 2021-11-05 LAB — CBC
HCT: 27.5 % — ABNORMAL LOW (ref 36.0–46.0)
Hemoglobin: 9.7 g/dL — ABNORMAL LOW (ref 12.0–15.0)
MCH: 32.7 pg (ref 26.0–34.0)
MCHC: 35.3 g/dL (ref 30.0–36.0)
MCV: 92.6 fL (ref 80.0–100.0)
Platelets: 196 10*3/uL (ref 150–400)
RBC: 2.97 MIL/uL — ABNORMAL LOW (ref 3.87–5.11)
RDW: 12.4 % (ref 11.5–15.5)
WBC: 8.9 10*3/uL (ref 4.0–10.5)
nRBC: 0 % (ref 0.0–0.2)

## 2021-11-05 LAB — UREA NITROGEN, URINE: Urea Nitrogen, Ur: 162 mg/dL

## 2021-11-05 LAB — URINE CULTURE: Culture: NO GROWTH

## 2021-11-05 LAB — BETA-HYDROXYBUTYRIC ACID: Beta-Hydroxybutyric Acid: 0.13 mmol/L (ref 0.05–0.27)

## 2021-11-05 LAB — TROPONIN I (HIGH SENSITIVITY): Troponin I (High Sensitivity): 1139 ng/L (ref <18)

## 2021-11-05 LAB — PHOSPHORUS: Phosphorus: 2.5 mg/dL (ref 2.5–4.6)

## 2021-11-05 MED ORDER — PIPERACILLIN-TAZOBACTAM 3.375 G IVPB
3.3750 g | Freq: Three times a day (TID) | INTRAVENOUS | Status: DC
  Administered 2021-11-05 – 2021-11-07 (×6): 3.375 g via INTRAVENOUS
  Filled 2021-11-05 (×6): qty 50

## 2021-11-05 MED ORDER — ATORVASTATIN CALCIUM 20 MG PO TABS
20.0000 mg | ORAL_TABLET | Freq: Every day | ORAL | Status: DC
  Administered 2021-11-05 – 2021-11-06 (×2): 20 mg via ORAL
  Filled 2021-11-05 (×2): qty 2

## 2021-11-05 MED ORDER — POTASSIUM CHLORIDE 20 MEQ PO PACK
40.0000 meq | PACK | Freq: Once | ORAL | Status: AC
  Administered 2021-11-05: 40 meq via ORAL
  Filled 2021-11-05: qty 2

## 2021-11-05 MED ORDER — ADULT MULTIVITAMIN W/MINERALS CH
1.0000 | ORAL_TABLET | Freq: Every day | ORAL | Status: DC
  Administered 2021-11-05 – 2021-11-07 (×3): 1 via ORAL
  Filled 2021-11-05 (×3): qty 1

## 2021-11-05 MED ORDER — POTASSIUM CHLORIDE CRYS ER 20 MEQ PO TBCR
40.0000 meq | EXTENDED_RELEASE_TABLET | Freq: Once | ORAL | Status: AC
Start: 2021-11-05 — End: 2021-11-05
  Administered 2021-11-05: 40 meq via ORAL
  Filled 2021-11-05: qty 2

## 2021-11-05 MED ORDER — INSULIN GLARGINE-YFGN 100 UNIT/ML ~~LOC~~ SOLN
15.0000 [IU] | Freq: Every day | SUBCUTANEOUS | Status: AC
  Administered 2021-11-05: 15 [IU] via SUBCUTANEOUS
  Filled 2021-11-05: qty 0.15

## 2021-11-05 MED ORDER — INSULIN GLARGINE-YFGN 100 UNIT/ML ~~LOC~~ SOLN
10.0000 [IU] | Freq: Every day | SUBCUTANEOUS | Status: AC
  Administered 2021-11-06: 10 [IU] via SUBCUTANEOUS
  Filled 2021-11-05: qty 0.1

## 2021-11-05 MED ORDER — METOPROLOL SUCCINATE ER 25 MG PO TB24
25.0000 mg | ORAL_TABLET | Freq: Every morning | ORAL | Status: DC
  Administered 2021-11-06 – 2021-11-07 (×2): 25 mg via ORAL
  Filled 2021-11-05 (×2): qty 1

## 2021-11-05 MED ORDER — INSULIN ASPART 100 UNIT/ML IJ SOLN: 0.0000 [IU] | Freq: Three times a day (TID) | INTRAMUSCULAR | Status: DC

## 2021-11-05 MED ORDER — INSULIN ASPART 100 UNIT/ML IJ SOLN
0.0000 [IU] | Freq: Every day | INTRAMUSCULAR | Status: DC
  Administered 2021-11-05 – 2021-11-06 (×2): 2 [IU] via SUBCUTANEOUS

## 2021-11-05 MED ORDER — INSULIN STARTER KIT- PEN NEEDLES (ENGLISH)
1.0000 | Freq: Once | Status: AC
Start: 2021-11-05 — End: 2021-11-05
  Administered 2021-11-05: 1
  Filled 2021-11-05: qty 1

## 2021-11-05 MED ORDER — LIVING WELL WITH DIABETES BOOK
Freq: Once | Status: AC
  Filled 2021-11-05: qty 1

## 2021-11-05 MED ORDER — INSULIN ASPART 100 UNIT/ML IJ SOLN
0.0000 [IU] | Freq: Three times a day (TID) | INTRAMUSCULAR | Status: DC
  Administered 2021-11-05 – 2021-11-06 (×4): 2 [IU] via SUBCUTANEOUS
  Administered 2021-11-06: 3 [IU] via SUBCUTANEOUS
  Administered 2021-11-06 – 2021-11-07 (×3): 2 [IU] via SUBCUTANEOUS

## 2021-11-05 MED ORDER — SODIUM CHLORIDE 0.9 % IV SOLN: INTRAVENOUS | Status: DC

## 2021-11-05 NOTE — Progress Notes (Addendum)
PROGRESS NOTE    Jody Ball  PJK:[id number] DOB: June 03, 1970 DOA: 11/03/2021 PCP: [facility] (Inactive)    Brief Narrative:   51 year old female with past medical history hypertension, hyperlipidemia, non-insulin-dependent diabetes mellitus type 2 who presents to [facility] emergency department with complaints of malaise and fever. She was found to have pyelonephritis and DKA progressed to shock now on pressors and transferring to ICU  Assessment and Plan: * Septic shock due to urinary tract infection [facility]) Patient presenting with severe right-sided pyelonephritis in the setting of multiple SIRS criteria and evidence of organ dysfunction with acute kidney injury all concerning for sepsis with organ dysfunction Likely gram-negative organism as the cause Off levophed  Blood cultures and urine cultures obtained, antibiotic therapy will be deescalated based on these results: no growth thus far   Acute kidney injury (AKI) with acute tubular necrosis (ATN) (HCC) Patient exhibiting evidence of acute kidney injury secondary to severe volume depletion, sepsis and pyelonephritis Holding home regimen of hydrochlorothiazide and lisinopril. Strict input and output monitoring Avoiding nephrotoxic agents if at all possible IVF Daily labs  Elevated troponins likely secondary to supply demand ischemia  -cardiology not officially consulted- PCCM discussed with Dr. Odis Hollingshead -echo done -will need ACS work up once kidney function improved and sepsis picture resolves- inpatient vs outpatient   Metabolic acidosis multifactorial with DKA, lactate, metformin use and sepsis   T2DM with hyperglycemia: DKA Substantial hyperglycemia in the setting of sepsis  Patient was initiated on insulin infusion but now will be changed to SQ insulin Hemoglobin A1C: 7.8 Diabetic coordinator   Hyponatremia Modest hyponatremia likely secondary to hyperglycemia  There may be an  element of true hyponatremia underlying this due to volume depletion  hydrating patient with intravenous isotonic fluids Daily labs  Essential hypertension Holding home regimen of oral antihypertensive therapy in the setting of hypotension As needed intravenous antihypertensives for markedly elevated blood pressure  Mixed diabetic hyperlipidemia associated with type 2 diabetes mellitus (HCC) Holding statin  Hypokalemia -replete -check Mg  Anemia -unclear etiology -daily labs -transfuse for <7       DVT prophylaxis: Place and maintain sequential compression device Start: 11/04/21 1459    Code Status: Full Code Family Communication:   Disposition Plan:  Level of care: Stepdown Status is: Inpatient Remains inpatient appropriate because: needs IV abx    Consultants:  Cards (phone-- not official-- will need to re consult if needed) PCCM   Subjective: No SOB, no CP- hungry  Objective: Vitals:   11/04/21 2300 11/05/21 0000 11/05/21 0400 11/05/21 0700  BP:  (!) 86/35  (!) 109/56  Pulse:  96  92  Resp:  17  20  Temp: 98.6 F (37 C)  98 F (36.7 C)   TempSrc: Oral     SpO2:  94%  98%  Weight:      Height:        Intake/Output Summary (Last 24 hours) at 11/05/2021 0821 Last data filed at 11/05/2021 0657 Gross per 24 hour  Intake 2896.81 ml  Output 1600 ml  Net 1296.81 ml   Filed Weights   11/03/21 1559 11/04/21 0033  Weight: 66.7 kg 71.1 kg    Examination:   General: Appearance:     Overweight female in no acute distress     Lungs:     respirations unlabored  Heart:    Normal heart rate   MS:   All extremities are intact.    Neurologic:   Awake, alert,  oriented x 3. No apparent focal neurological           defect.        Data Reviewed: I have personally reviewed following labs and imaging studies  CBC: Recent Labs  Lab 11/03/21 1631 11/04/21 0335 11/04/21 0756 11/05/21 0406  WBC 9.6 19.1* 16.7* 8.9  NEUTROABS  --  16.2*  --   --    HGB 12.7 10.2* 10.8* 9.7*  HCT 37.9 31.7* 32.9* 27.5*  MCV 96.9 99.7 98.8 92.6  PLT 238 256 254 196   Basic Metabolic Panel: Recent Labs  Lab 11/04/21 0335 11/04/21 0714 11/04/21 0756 11/04/21 1246 11/04/21 1724 11/05/21 0406  NA 127*  --  128* 128* 131* 134*  K 3.4*  --  4.0 3.4* 3.2* 3.0*  CL 80*  --  92* 93* 94* 98  CO2 18*  --  9* 13* 20* 26  GLUCOSE 252*  --  195* 196* 124* 156*  BUN 57*  --  63* 62* 61* 45*  CREATININE 4.19*  --  4.90* 4.50* 3.78* 2.69*  CALCIUM 6.7*  --  7.7* 7.0* 7.3* 7.6*  MG 2.2 2.3  --   --   --   --   PHOS  --  5.5*  --   --   --   --    GFR: Estimated Creatinine Clearance: 24.2 mL/min (A) (by C-G formula based on SCr of 2.69 mg/dL (H)). Liver Function Tests: Recent Labs  Lab 11/03/21 1631 11/04/21 0335 11/04/21 0756 11/05/21 0406  AST 30 21 24 23   ALT 25 17 17 14   ALKPHOS 66 51 56 44  BILITOT 0.5 1.3* 1.6* 0.5  PROT 7.0 4.8* 5.2* 4.8*  ALBUMIN 3.0* 2.0* 2.1* 1.9*   Recent Labs  Lab 11/03/21 1631  LIPASE 26   No results for input(s): "AMMONIA" in the last 168 hours. Coagulation Profile: Recent Labs  Lab 11/03/21 1651  INR 0.9   Cardiac Enzymes: No results for input(s): "CKTOTAL", "CKMB", "CKMBINDEX", "TROPONINI" in the last 168 hours. BNP (last 3 results) No results for input(s): "PROBNP" in the last 8760 hours. HbA1C: Recent Labs    11/04/21 0035  HGBA1C 7.8*   CBG: Recent Labs  Lab 11/05/21 0410 11/05/21 0444 11/05/21 0608 11/05/21 0731 11/05/21 0812  GLUCAP 152* 173* 146* 140* 161*   Lipid Profile: No results for input(s): "CHOL", "HDL", "LDLCALC", "TRIG", "CHOLHDL", "LDLDIRECT" in the last 72 hours. Thyroid Function Tests: No results for input(s): "TSH", "T4TOTAL", "FREET4", "T3FREE", "THYROIDAB" in the last 72 hours. Anemia Panel: No results for input(s): "VITAMINB12", "FOLATE", "FERRITIN", "TIBC", "IRON", "RETICCTPCT" in the last 72 hours. Sepsis Labs: Recent Labs  Lab 11/04/21 0035 11/04/21 0446  11/04/21 0714 11/04/21 1246 11/05/21 0406  PROCALCITON  --   --  24.47  --   --   LATICACIDVEN 8.1* 5.2*  --  2.8* 2.6*    Recent Results (from the past 240 hour(s))  Blood Culture (routine x 2)     Status: None (Preliminary result)   Collection Time: 11/03/21  4:53 PM   Specimen: BLOOD  Result Value Ref Range Status   Specimen Description   Final    BLOOD LEFT ANTECUBITAL Performed at [facility], [address] [place], [place] [address]    Special Requests   Final    BOTTLES DRAWN AEROBIC AND ANAEROBIC Blood Culture results may not be optimal due to an inadequate volume of blood received in culture bottles Performed at [facility],  [address], [place], [place] [address]    Culture   Final    NO GROWTH < 24 HOURS Performed at [facility], [address], [place], [place] [address]    Report Status PENDING  Incomplete  Blood Culture (routine x 2)     Status: None (Preliminary result)   Collection Time: 11/03/21  6:00 PM   Specimen: BLOOD  Result Value Ref Range Status   Specimen Description   Final    BLOOD LEFT ANTECUBITAL Performed at [facility], [address], [place], [place] [address]    Special Requests   Final    BOTTLES DRAWN AEROBIC AND ANAEROBIC Blood Culture results may not be optimal due to an inadequate volume of blood received in culture bottles Performed at [facility], [address], [place], [place] [address]    Culture   Final    NO GROWTH < 12 HOURS Performed at [facility], [address], [place], [place] [address]    Report Status PENDING  Incomplete  Urine Culture     Status: None   Collection Time: 11/03/21  8:21 PM   Specimen: In/Out Cath Urine  Result Value Ref Range Status   Specimen Description   Final    IN/OUT CATH URINE Performed at [facility], [address], [place], [place] [address]    Special Requests   Final     NONE Performed at [facility], [address] [place], [place] [address]    Culture   Final    NO GROWTH Performed at [facility], [address] [place], [place] [address]    Report Status 11/05/2021 FINAL  Final  MRSA Next Gen by PCR, Nasal     Status: None   Collection Time: 11/04/21 12:25 AM   Specimen: Nasal Mucosa; Nasal Swab  Result Value Ref Range Status   MRSA by PCR Next Gen NOT DETECTED NOT DETECTED Final    Comment: (NOTE) The GeneXpert MRSA Assay (FDA approved for NASAL specimens only), is one component of a comprehensive MRSA colonization surveillance program. It is not intended to diagnose MRSA infection nor to guide or monitor treatment for MRSA infections. Test performance is not FDA approved in patients less than 53 years old. Performed at [facility], [address] [place], [place] [address]          Radiology Studies: ECHOCARDIOGRAM COMPLETE  Result Date: 11/04/2021    ECHOCARDIOGRAM REPORT   Patient Name:   HOLIDAY MCMENAMIN Date of Exam: 11/04/2021 Medical Rec #:  [id number]    Height:       65.0 in Accession #:    [id number]   Weight:       156.7 lb Date of Birth:  1971-04-26     BSA:          1.784 m Patient Age:    51 years     BP:           121/66 mmHg Patient Gender: F            HR:           86 bpm. Exam Location:  Inpatient Procedure: 2D Echo, Cardiac Doppler and Color Doppler Indications:     diabetic ketoacidosis  History:         Patient has prior history of Echocardiogram examinations, most  recent 04/22/2009. Signs/Symptoms:Chest pain - non specifiic;                  Risk Factors:Hypertension, Diabetes and Dyslipidemia.  Sonographer:     Leta Jungling RDCS Referring Phys:  [id number] [facility] RAMASWAMY Diagnosing Phys: Tessa Lerner DO  Sonographer Comments: Suboptimal apical window. Limited movement due to left central line. IMPRESSIONS  1. Left ventricular ejection fraction, by estimation, is 60  to 65%. The left ventricle has normal function. The left ventricle has no regional wall motion abnormalities. Left ventricular diastolic parameters were normal.  2. Right ventricular systolic function is normal. The right ventricular size is normal. There is normal pulmonary artery systolic pressure.  3. The mitral valve is normal in structure. Mild mitral valve regurgitation. No evidence of mitral stenosis.  4. The aortic valve is tricuspid. Aortic valve regurgitation is not visualized. No aortic stenosis is present.  5. The inferior vena cava is dilated in size with >50% respiratory variability, suggesting right atrial pressure of 8 mmHg.  6. Rhythm strip during this exam demostrated normal sinus rhythm. Comparison(s): No prior Echocardiogram. FINDINGS  Left Ventricle: Left ventricular ejection fraction, by estimation, is 60 to 65%. The left ventricle has normal function. The left ventricle has no regional wall motion abnormalities. The left ventricular internal cavity size was normal in size. There is  no left ventricular hypertrophy. Left ventricular diastolic parameters were normal. Right Ventricle: The right ventricular size is normal. No increase in right ventricular wall thickness. Right ventricular systolic function is normal. There is normal pulmonary artery systolic pressure. The tricuspid regurgitant velocity is 2.08 m/s, and  with an assumed right atrial pressure of 3 mmHg, the estimated right ventricular systolic pressure is 20.3 mmHg. Left Atrium: Left atrial size was normal in size. Right Atrium: Right atrial size was normal in size. Pericardium: There is no evidence of pericardial effusion. Mitral Valve: The mitral valve is normal in structure. Mild mitral valve regurgitation. No evidence of mitral valve stenosis. Tricuspid Valve: The tricuspid valve is normal in structure. Tricuspid valve regurgitation is mild . No evidence of tricuspid stenosis. Aortic Valve: The aortic valve is tricuspid. Aortic  valve regurgitation is not visualized. No aortic stenosis is present. Pulmonic Valve: The pulmonic valve was not well visualized. Pulmonic valve regurgitation is not visualized. No evidence of pulmonic stenosis. Aorta: The aortic root is normal in size and structure and the aortic root and ascending aorta are structurally normal, with no evidence of dilitation. Venous: The inferior vena cava is dilated in size with greater than 50% respiratory variability, suggesting right atrial pressure of 8 mmHg. IAS/Shunts: The interatrial septum was not well visualized. EKG: Rhythm strip during this exam demostrated normal sinus rhythm.  LEFT VENTRICLE PLAX 2D LVIDd:         4.10 cm   Diastology LVIDs:         2.70 cm   LV e' medial:    8.05 cm/s LV PW:         0.90 cm   LV E/e' medial:  13.4 LV IVS:        0.90 cm   LV e' lateral:   10.90 cm/s LVOT diam:     2.00 cm   LV E/e' lateral: 9.9 LV SV:         52 LV SV Index:   29 LVOT Area:     3.14 cm  RIGHT VENTRICLE             IVC  RV S prime:     14.60 cm/s  IVC diam: 2.21 cm TAPSE (M-mode): 2.7 cm LEFT ATRIUM             Index        RIGHT ATRIUM           Index LA diam:        3.00 cm 1.68 cm/m   RA Area:     12.20 cm LA Vol (A2C):   27.9 ml 15.64 ml/m  RA Volume:   25.30 ml  14.19 ml/m LA Vol (A4C):   27.4 ml 15.36 ml/m LA Biplane Vol: 29.6 ml 16.60 ml/m  AORTIC VALVE LVOT Vmax:   85.50 cm/s LVOT Vmean:  55.700 cm/s LVOT VTI:    0.167 m  AORTA Ao Root diam: 2.90 cm Ao Asc diam:  2.80 cm MITRAL VALVE                TRICUSPID VALVE MV Area (PHT): 5.31 cm     TR Peak grad:   17.3 mmHg MV Decel Time: 143 msec     TR Vmax:        208.00 cm/s MV E velocity: 108.00 cm/s MV A velocity: 62.10 cm/s   SHUNTS MV E/A ratio:  1.74         Systemic VTI:  0.17 m                             Systemic Diam: 2.00 cm Sunit Tolia DO Electronically signed by Tessa Lerner DO Signature Date/Time: 11/04/2021/10:54:11 AM    Final    US RENAL  Result Date: 11/04/2021 CLINICAL DATA:  AK I EXAM:  RENAL / URINARY TRACT ULTRASOUND COMPLETE COMPARISON:  CT abdomen and pelvis dated November 03, 2021 FINDINGS: Right Kidney: Renal measurements: 11.6 x 5.1 x 5.6 cm = volume: 735 mL. Echogenicity within normal limits. No mass or hydronephrosis visualized. Left Kidney: Renal measurements: 11.4 x 5.1 x 5.1 cm = volume: 156 mL. Echogenicity within normal limits. No mass or hydronephrosis visualized. Bladder: Appears normal for degree of bladder distention. Bilateral jets visualized. Other: None. IMPRESSION: No hydronephrosis or nephrolithiasis. Electronically Signed   By: Allegra Lai M.D.   On: 11/04/2021 08:53   DG Chest Portable 1 View  Result Date: 11/04/2021 CLINICAL DATA:  51 year old female line placement. EXAM: PORTABLE CHEST 1 VIEW COMPARISON:  Portable chest 11/03/2021. FINDINGS: Portable AP semi upright view at 0430 hours. Left IJ approach central line placed, tip is at the cavoatrial junction level. Mildly lower lung volumes. No pneumothorax. Normal cardiac size and mediastinal contours. Visualized tracheal air column is within normal limits. Allowing for portable technique the lungs are clear. No acute osseous abnormality identified. IMPRESSION: 1. Left IJ central line placed, tip at the cavoatrial junction level and no adverse features. 2. No acute cardiopulmonary abnormality. Electronically Signed   By: Odessa Fleming M.D.   On: 11/04/2021 04:56   CT ABDOMEN PELVIS WO CONTRAST  Result Date: 11/03/2021 CLINICAL DATA:  Urinary tract infection. Fever. Hypotension. Change in bowel habits. EXAM: CT ABDOMEN AND PELVIS WITHOUT CONTRAST TECHNIQUE: Multidetector CT imaging of the abdomen and pelvis was performed following the standard protocol without IV contrast. RADIATION DOSE REDUCTION: This exam was performed according to the departmental dose-optimization program which includes automated exposure control, adjustment of the mA and/or kV according to patient size and/or use of iterative reconstruction  technique. COMPARISON:  None Available. FINDINGS: Lower chest:  No acute findings. Hepatobiliary: No mass visualized on this unenhanced exam. Contrast material is noted within the gallbladder which is otherwise unremarkable in appearance. No evidence of biliary ductal dilatation. Pancreas: No mass or inflammatory process visualized on this unenhanced exam. Spleen:  Within normal limits in size. Adrenals/Urinary tract: Right renal swelling is seen with numerous areas of decreased parenchymal enhancement and mild perinephric soft tissue stranding. These findings are consistent with severe pyelonephritis. No renal abscess identified. No evidence of ureteral calculi or hydronephrosis. Unremarkable urinary bladder. Stomach/Bowel: No evidence of obstruction, inflammatory process, or abnormal fluid collections. Vascular/Lymphatic: No pathologically enlarged lymph nodes identified. No evidence of abdominal aortic aneurysm. Reproductive:  No mass or other significant abnormality. Other:  None. Musculoskeletal:  No suspicious bone lesions identified. IMPRESSION: Severe right pyelonephritis. No evidence of renal abscess, ureteral calculus, or hydronephrosis. Electronically Signed   By: Danae Orleans M.D.   On: 11/03/2021 18:54   DG Chest Port 1 View  Result Date: 11/03/2021 CLINICAL DATA:  Possible sepsis EXAM: PORTABLE CHEST 1 VIEW COMPARISON:  09/24/2017 FINDINGS: The heart size and mediastinal contours are within normal limits. Both lungs are clear. The visualized skeletal structures are unremarkable. IMPRESSION: No active disease. Electronically Signed   By: Ernie Avena M.D.   On: 11/03/2021 16:51        Scheduled Meds:  aspirin  81 mg Oral Daily   Chlorhexidine Gluconate Cloth  6 each Topical Daily   feeding supplement  237 mL Oral BID BM   insulin aspart  0-5 Units Subcutaneous QHS   insulin aspart  0-9 Units Subcutaneous TID WC   magic mouthwash  2 mL Oral TID   sodium chloride flush  3 mL  Intravenous Q12H   Continuous Infusions:  sodium chloride     piperacillin-tazobactam (ZOSYN)  IV Stopped (11/05/21 0644)     LOS: 2 days    Time spent: 45 minutes spent on chart review, discussion with nursing staff, consultants, updating family and interview/physical exam; more than 50% of that time was spent in counseling and/or coordination of care.    Joseph Art, DO [facility] Available via Epic secure chat 7am-7pm After these hours, please refer to coverage provider listed on [email or web address] 11/05/2021, 8:21 AM

## 2021-11-05 NOTE — Progress Notes (Signed)
PHARMACY NOTE:  ANTIMICROBIAL RENAL DOSAGE ADJUSTMENT  Current antimicrobial regimen includes a mismatch between antimicrobial dosage and estimated renal function.  As per policy approved by the Pharmacy & Therapeutics and Medical Executive Committees, the antimicrobial dosage will be adjusted accordingly.  Current antimicrobial dosage:  Zosyn 2.25 g IV q8h  Indication: sepsis/UTI  Renal Function:  Estimated Creatinine Clearance: 24.2 mL/min (A) (by C-G formula based on SCr of 2.69 mg/dL (H)).     Antimicrobial dosage has been changed to:  Zosyn 3.375 g IV q8h    Thank you for allowing pharmacy to be a part of this patient's care.  Pricilla Riffle, PharmD, BCPS Clinical Pharmacist 11/05/2021 8:40 AM

## 2021-11-05 NOTE — Progress Notes (Signed)
eLink Physician-Brief Progress Note Patient Name: Jody Ball DOB: August 12, 1970 MRN: [id number]   Date of Service  11/05/2021  HPI/Events of Note  Anion gap closed, K+ = 3.0   eICU Interventions  D/c insulin infusion/D5LR infusion . Allow Diet- pt tolerating PO- Ordered K+ oral. One dose Semglee Dumas now     Sandrika Schwinn N Thiago Ragsdale 11/05/2021, 5:53 AM

## 2021-11-05 NOTE — Inpatient Diabetes Management (Signed)
Inpatient Diabetes Program Recommendations  AACE/ADA: New Consensus Statement on Inpatient Glycemic Control (2015)  Target Ranges:  Prepandial:   less than 140 mg/dL      Peak postprandial:   less than 180 mg/dL (1-2 hours)      Critically ill patients:  140 - 180 mg/dL   Lab Results  Component Value Date   GLUCAP 123 (H) 11/05/2021   HGBA1C 7.8 (H) 11/04/2021    Latest Reference Range & Units 11/05/21 04:06  GFR, Estimated >60 mL/min 21 (L)  (L): Data is abnormally low   Review of Glycemic Control  Diabetes history: DM2 Outpatient Diabetes medications: Janumet 50-1 gm. bid Current orders for Inpatient glycemic control: Semglee 10 units, Novolog 0-9 units correction tid + hs 0-5 units   Inpatient Diabetes Program Recommendations:   Due to GFR 21, will order insulin pen starter kit per order Dr. Eliseo Squires to prepare patient if discharged home on insulin. Also ordered Living Well With Diabetes booklet for patient review.  Thank you, Nani Gasser. Kimiya Brunelle, RN, MSN, CDE  Diabetes Coordinator Inpatient Glycemic Control Team Team Pager [phone] (8am-5pm) 11/05/2021 1:11 PM

## 2021-11-05 NOTE — Discharge Instructions (Signed)

## 2021-11-05 NOTE — Progress Notes (Signed)
Initial Nutrition Assessment RD working remotely. DOCUMENTATION CODES:   Not applicable  INTERVENTION:  - continue Ensure Plus High Protein BID, each supplement provides 350 kcal and 20 grams of protein.  - will enter Carbohydrate Counting for People with Diabetes handout into AVS.  - complete NFPE when feasible.   NUTRITION DIAGNOSIS:   Increased nutrient needs related to acute illness as evidenced by estimated needs.  GOAL:   Patient will meet greater than or equal to 90% of their needs  MONITOR:   PO intake, Supplement acceptance, Labs, Weight trends  REASON FOR ASSESSMENT:   Malnutrition Screening Tool  ASSESSMENT:   51 year old female with medical history of HTN, HLD, and non-insulin-dependent type 2 DM. she presented to the ED due to malaise and fever. She was dx with pyelonephritis and DKA which progressed to shock. She was transferred to the ICU due to need for pressure support.  Diet advanced to CLD yesterday AM and to Carb Modified today at 0753. No intakes documented. Ensure was ordered BID on 6/17 and review of order indicates that 2/3 were accepted.   She has not been seen by a [place] RD at any time in the past.  Review of MST report note indicates patient reported losing 2 lb in the 1 week PTA.  Weight yesterday was 157 lb and PTA the most recently documented weight was 153 lb at [facility] on 4/3. This indicates 4 lb weight loss in the past 2 months. No information documented in the edema section of flow sheet this admission.  Per notes: - septic shock d/t UTI - AKI with ATN - metabolic acidosis - DKA on admission   Labs reviewed; HgbA1c: 7.8%, CBGs: 140-173 mg/dl, Na: 409 mmol/l, K: 3 mmol/l, BUN: 45 mg/dl, creatinine: 8.11 mg/dl, Ca: 7.6 mg/dl, GFR: 21 ml/min.  Medications reviewed; sliding scale novolog, 10 units semglee/day, 1 tablet multivitamin with minerals/day, 40 mEq Klor-Con x1 dose 6/18.  IVF; NS @ 75 ml/hr.   NUTRITION - FOCUSED  PHYSICAL EXAM:  RD working remotely.  Diet Order:   Diet Order             Diet Carb Modified Fluid consistency: Thin; Room service appropriate? Yes  Diet effective now                   EDUCATION NEEDS:   Not appropriate for education at this time  Skin:  Skin Assessment: Reviewed RN Assessment  Last BM:  6/17 (type 6 x1, large amount)  Height:   Ht Readings from Last 1 Encounters:  11/04/21 5\' 5"  (1.651 m)    Weight:   Wt Readings from Last 1 Encounters:  11/04/21 71.1 kg     BMI:  Body mass index is 26.08 kg/m.  Estimated Nutritional Needs:  Kcal:  1950-2150 kcal Protein:  100-110 grams Fluid:  >/= 2.2 L/day      11/06/21, MS, RD, LDN, CNSC Registered Dietitian II Inpatient Clinical Nutrition RD pager # and on-call/weekend pager # available in [facility]

## 2021-11-05 NOTE — Consult Note (Signed)
CARDIOLOGY CONSULT NOTE  Patient ID: Jody Ball MRN: [id number] DOB/AGE: [birth year] 51 y.o.  Admit date: 11/03/2021 Attending physician: Joseph Art, DO Primary Physician:  Dr. Darol Destine Outpatient Cardiologist: None Inpatient Cardiologist: Tessa Lerner, DO, [facility] (new)  Reason of consultation: Elevated troponin Referring physician: Joseph Art, DO  Chief complaint: Abdominal/back pain  HPI:  Jody Ball is a 51 y.o. Caucasian female who presents with a chief complaint of " abdominal/back pain." Her past medical history and cardiovascular risk factors include: Hypertension, hyperlipidemia, non-insulin-dependent diabetes mellitus type 2.  Patient presents with abdominal and back pain along with fevers and malaise.  She was diagnosed with acute pyelonephritis/UTI.  Initial vital signs and labs consistent with DKA and septic shock and patient was transferred to ICU for further evaluation and management and required pressors.  CCM called cardiology yesterday 11/04/2021 given the elevated troponins and nonspecific chest pain.  At that time her elevated hs troponin of 655 was likely secondary to acute kidney injury (Cr 4.9mg /dL), diabetic ketoacidosis, Lactic acidosis peaked at 8.1, and recent pH of 7.03, multiple electrolyte abnormalities.  Her precordial discomfort was nonspecific, EKG did not illustrate ischemia, and echocardiogram noted preserved LVEF without regional wall motion abnormalities.  And therefore the shared decision was to hold off on ischemic work-up as she is at high risk of contrast-induced nephropathy if angiography or CCTA were to be undertaken.   High sensitive troponins were trended overnight which peaked at 2495 and now down trending.  Patient is transferred out of ICU as DKA essentially resolved, off of pressor support, renal function improving (2.69mg /dL), no chest pain, and cardiology is consulted now by internal medicine attending for further evaluation and  management.  Patient denies angina pectoris or heart failure symptoms.  She is resting in bed comfortably.  Denies any prior history of myocardial infarction/PCI/CAD/CHF.  She is recovering well from her recent sepsis/DKA.  She continues to have hematuria likely due to underlying pyelonephritis.  No prior stress test/coronary CTA/angiography.  She follows up with her primary care provider in [place].  ALLERGIES: No Known Allergies  PAST MEDICAL HISTORY: Past Medical History:  Diagnosis Date   Diabetes mellitus without complication (HCC)    High cholesterol    Hypertension     PAST SURGICAL HISTORY: Past Surgical History:  Procedure Laterality Date   BUNIONECTOMY Left    VARICOSE VEIN SURGERY Bilateral     FAMILY HISTORY: Mom had cancer and heart disease. Dad had colon cancer.   SOCIAL HISTORY:  The patient  reports that she has never smoked. She has never used smokeless tobacco. She reports current alcohol use. She reports that she does not use drugs.  MEDICATIONS: Current Outpatient Medications  Medication Instructions   acetaminophen (TYLENOL) 1,000 mg, Oral, Every evening   ASHWAGANDHA PO 1 tablet, Oral, Daily   atorvastatin (LIPITOR) 20 mg, Oral, Every evening   busPIRone (BUSPAR) 5 mg, Oral, 2 times daily PRN   ciprofloxacin (CIPRO) 500 mg, Oral, 2 times daily   fluticasone (FLONASE) 50 MCG/ACT nasal spray 2 sprays, Each Nare, 2 times daily PRN   hydrochlorothiazide (HYDRODIURIL) 25 mg, Oral, Daily   JANUMET 50-1000 MG tablet 1 tablet, Oral, 2 times daily   lisinopril (ZESTRIL) 10 mg, Oral, Every evening   lisinopril (ZESTRIL) 20 mg, Oral, Every evening   LORazepam (ATIVAN) 1 mg, Oral, Every 6 hours PRN   ondansetron (ZOFRAN) 4 mg, Oral, Every 8 hours PRN   SPRINTEC 28 0.25-35 MG-MCG tablet 1 tablet, Oral, Daily  REVIEW OF SYSTEMS: Review of Systems  Constitutional: Negative for chills and fever.  HENT:  Negative for hoarse voice and nosebleeds.    Eyes:  Negative for discharge, double vision and pain.  Cardiovascular:  Negative for chest pain, claudication, dyspnea on exertion, leg swelling, near-syncope, orthopnea, palpitations, paroxysmal nocturnal dyspnea and syncope.  Respiratory:  Negative for hemoptysis and shortness of breath.   Musculoskeletal:  Negative for muscle cramps and myalgias.  Gastrointestinal:  Negative for abdominal pain, constipation, diarrhea, hematemesis, hematochezia, melena, nausea and vomiting.  Genitourinary:  Positive for hematuria.  Neurological:  Negative for dizziness and light-headedness.  All other systems reviewed and are negative.   PHYSICAL EXAM:    11/05/2021   11:00 AM 11/05/2021   10:00 AM 11/05/2021    9:00 AM  Vitals with BMI  Systolic [id number]  Diastolic 64 65 82  Pulse 90 88 92     Intake/Output Summary (Last 24 hours) at 11/05/2021 1136 Last data filed at 11/05/2021 1100 Gross per 24 hour  Intake 1427.84 ml  Output 2200 ml  Net -772.16 ml    Net IO Since Admission: 7,481.6 mL [11/05/21 1136]  CONSTITUTIONAL: Age-appropriate female, hemodynamically stable, no acute distress. SKIN: Skin is warm and dry. No rash noted. No cyanosis. No pallor. No jaundice HEAD: Normocephalic and atraumatic.  EYES: No scleral icterus MOUTH/THROAT: Moist oral membranes.  NECK: No JVD present. No thyromegaly noted. No carotid bruits.  Left IJ central line present. CHEST Normal respiratory effort. No intercostal retractions  LUNGS: Clear to auscultation bilaterally.  No stridor. No wheezes. No rales.  CARDIOVASCULAR: Regular rate and rhythm, positive S1-S2, no murmurs rubs or gallops appreciated ABDOMINAL: Soft, nontender, nondistended, positive bowel sounds in all 4 quadrants, no apparent ascites.  EXTREMITIES: No pitting edema, warm to touch.  HEMATOLOGIC: No significant bruising NEUROLOGIC: Oriented to person, place, and time. Nonfocal. Normal muscle tone.  PSYCHIATRIC: Normal mood and affect.  Normal behavior. Cooperative  RADIOLOGY: ECHOCARDIOGRAM COMPLETE  Result Date: 11/04/2021    ECHOCARDIOGRAM REPORT   Patient Name:   Jody Ball Date of Exam: 11/04/2021 Medical Rec #:  [id number]    Height:       65.0 in Accession #:    [id number]   Weight:       156.7 lb Date of Birth:  05/17/1971     BSA:          1.784 m Patient Age:    54 years     BP:           121/66 mmHg Patient Gender: F            HR:           86 bpm. Exam Location:  Inpatient Procedure: 2D Echo, Cardiac Doppler and Color Doppler Indications:     diabetic ketoacidosis  History:         Patient has prior history of Echocardiogram examinations, most                  recent 04/22/2009. Signs/Symptoms:Chest pain - non specifiic;                  Risk Factors:Hypertension, Diabetes and Dyslipidemia.  Sonographer:     Darlina Sicilian RDCS Referring Phys:  [id number] [facility] RAMASWAMY Diagnosing Phys: Rex Kras DO  Sonographer Comments: Suboptimal apical window. Limited movement due to left central line. IMPRESSIONS  1. Left ventricular ejection fraction, by estimation, is 60 to 65%. The left ventricle has normal  function. The left ventricle has no regional wall motion abnormalities. Left ventricular diastolic parameters were normal.  2. Right ventricular systolic function is normal. The right ventricular size is normal. There is normal pulmonary artery systolic pressure.  3. The mitral valve is normal in structure. Mild mitral valve regurgitation. No evidence of mitral stenosis.  4. The aortic valve is tricuspid. Aortic valve regurgitation is not visualized. No aortic stenosis is present.  5. The inferior vena cava is dilated in size with >50% respiratory variability, suggesting right atrial pressure of 8 mmHg.  6. Rhythm strip during this exam demostrated normal sinus rhythm. Comparison(s): No prior Echocardiogram. FINDINGS  Left Ventricle: Left ventricular ejection fraction, by estimation, is 60 to 65%. The left ventricle has normal function. The  left ventricle has no regional wall motion abnormalities. The left ventricular internal cavity size was normal in size. There is  no left ventricular hypertrophy. Left ventricular diastolic parameters were normal. Right Ventricle: The right ventricular size is normal. No increase in right ventricular wall thickness. Right ventricular systolic function is normal. There is normal pulmonary artery systolic pressure. The tricuspid regurgitant velocity is 2.08 m/s, and  with an assumed right atrial pressure of 3 mmHg, the estimated right ventricular systolic pressure is 20.3 mmHg. Left Atrium: Left atrial size was normal in size. Right Atrium: Right atrial size was normal in size. Pericardium: There is no evidence of pericardial effusion. Mitral Valve: The mitral valve is normal in structure. Mild mitral valve regurgitation. No evidence of mitral valve stenosis. Tricuspid Valve: The tricuspid valve is normal in structure. Tricuspid valve regurgitation is mild . No evidence of tricuspid stenosis. Aortic Valve: The aortic valve is tricuspid. Aortic valve regurgitation is not visualized. No aortic stenosis is present. Pulmonic Valve: The pulmonic valve was not well visualized. Pulmonic valve regurgitation is not visualized. No evidence of pulmonic stenosis. Aorta: The aortic root is normal in size and structure and the aortic root and ascending aorta are structurally normal, with no evidence of dilitation. Venous: The inferior vena cava is dilated in size with greater than 50% respiratory variability, suggesting right atrial pressure of 8 mmHg. IAS/Shunts: The interatrial septum was not well visualized. EKG: Rhythm strip during this exam demostrated normal sinus rhythm.  LEFT VENTRICLE PLAX 2D LVIDd:         4.10 cm   Diastology LVIDs:         2.70 cm   LV e' medial:    8.05 cm/s LV PW:         0.90 cm   LV E/e' medial:  13.4 LV IVS:        0.90 cm   LV e' lateral:   10.90 cm/s LVOT diam:     2.00 cm   LV E/e' lateral: 9.9  LV SV:         52 LV SV Index:   29 LVOT Area:     3.14 cm  RIGHT VENTRICLE             IVC RV S prime:     14.60 cm/s  IVC diam: 2.21 cm TAPSE (M-mode): 2.7 cm LEFT ATRIUM             Index        RIGHT ATRIUM           Index LA diam:        3.00 cm 1.68 cm/m   RA Area:     12.20 cm LA Vol (A2C):  27.9 ml 15.64 ml/m  RA Volume:   25.30 ml  14.19 ml/m LA Vol (A4C):   27.4 ml 15.36 ml/m LA Biplane Vol: 29.6 ml 16.60 ml/m  AORTIC VALVE LVOT Vmax:   85.50 cm/s LVOT Vmean:  55.700 cm/s LVOT VTI:    0.167 m  AORTA Ao Root diam: 2.90 cm Ao Asc diam:  2.80 cm MITRAL VALVE                TRICUSPID VALVE MV Area (PHT): 5.31 cm     TR Peak grad:   17.3 mmHg MV Decel Time: 143 msec     TR Vmax:        208.00 cm/s MV E velocity: 108.00 cm/s MV A velocity: 62.10 cm/s   SHUNTS MV E/A ratio:  1.74         Systemic VTI:  0.17 m                             Systemic Diam: 2.00 cm Reinhold Rickey DO Electronically signed by Rex Kras DO Signature Date/Time: 11/04/2021/10:54:11 AM    Final    US RENAL  Result Date: 11/04/2021 CLINICAL DATA:  AK I EXAM: RENAL / URINARY TRACT ULTRASOUND COMPLETE COMPARISON:  CT abdomen and pelvis dated November 03, 2021 FINDINGS: Right Kidney: Renal measurements: 11.6 x 5.1 x 5.6 cm = volume: 735 mL. Echogenicity within normal limits. No mass or hydronephrosis visualized. Left Kidney: Renal measurements: 11.4 x 5.1 x 5.1 cm = volume: 156 mL. Echogenicity within normal limits. No mass or hydronephrosis visualized. Bladder: Appears normal for degree of bladder distention. Bilateral jets visualized. Other: None. IMPRESSION: No hydronephrosis or nephrolithiasis. Electronically Signed   By: Yetta Glassman M.D.   On: 11/04/2021 08:53   DG Chest Portable 1 View  Result Date: 11/04/2021 CLINICAL DATA:  51 year old female line placement. EXAM: PORTABLE CHEST 1 VIEW COMPARISON:  Portable chest 11/03/2021. FINDINGS: Portable AP semi upright view at 0430 hours. Left IJ approach central line placed, tip  is at the cavoatrial junction level. Mildly lower lung volumes. No pneumothorax. Normal cardiac size and mediastinal contours. Visualized tracheal air column is within normal limits. Allowing for portable technique the lungs are clear. No acute osseous abnormality identified. IMPRESSION: 1. Left IJ central line placed, tip at the cavoatrial junction level and no adverse features. 2. No acute cardiopulmonary abnormality. Electronically Signed   By: Genevie Ann M.D.   On: 11/04/2021 04:56   CT ABDOMEN PELVIS WO CONTRAST  Result Date: 11/03/2021 CLINICAL DATA:  Urinary tract infection. Fever. Hypotension. Change in bowel habits. EXAM: CT ABDOMEN AND PELVIS WITHOUT CONTRAST TECHNIQUE: Multidetector CT imaging of the abdomen and pelvis was performed following the standard protocol without IV contrast. RADIATION DOSE REDUCTION: This exam was performed according to the departmental dose-optimization program which includes automated exposure control, adjustment of the mA and/or kV according to patient size and/or use of iterative reconstruction technique. COMPARISON:  None Available. FINDINGS: Lower chest: No acute findings. Hepatobiliary: No mass visualized on this unenhanced exam. Contrast material is noted within the gallbladder which is otherwise unremarkable in appearance. No evidence of biliary ductal dilatation. Pancreas: No mass or inflammatory process visualized on this unenhanced exam. Spleen:  Within normal limits in size. Adrenals/Urinary tract: Right renal swelling is seen with numerous areas of decreased parenchymal enhancement and mild perinephric soft tissue stranding. These findings are consistent with severe pyelonephritis. No renal abscess identified. No evidence of  ureteral calculi or hydronephrosis. Unremarkable urinary bladder. Stomach/Bowel: No evidence of obstruction, inflammatory process, or abnormal fluid collections. Vascular/Lymphatic: No pathologically enlarged lymph nodes identified. No  evidence of abdominal aortic aneurysm. Reproductive:  No mass or other significant abnormality. Other:  None. Musculoskeletal:  No suspicious bone lesions identified. IMPRESSION: Severe right pyelonephritis. No evidence of renal abscess, ureteral calculus, or hydronephrosis. Electronically Signed   By: Danae Orleans M.D.   On: 11/03/2021 18:54   DG Chest Port 1 View  Result Date: 11/03/2021 CLINICAL DATA:  Possible sepsis EXAM: PORTABLE CHEST 1 VIEW COMPARISON:  09/24/2017 FINDINGS: The heart size and mediastinal contours are within normal limits. Both lungs are clear. The visualized skeletal structures are unremarkable. IMPRESSION: No active disease. Electronically Signed   By: Ernie Avena M.D.   On: 11/03/2021 16:51    LABORATORY DATA: Lab Results  Component Value Date   WBC 8.9 11/05/2021   HGB 9.7 (L) 11/05/2021   HCT 27.5 (L) 11/05/2021   MCV 92.6 11/05/2021   PLT 196 11/05/2021    Recent Labs  Lab 11/05/21 0406  NA 134*  K 3.0*  CL 98  CO2 26  BUN 45*  CREATININE 2.69*  CALCIUM 7.6*  PROT 4.8*  BILITOT 0.5  ALKPHOS 44  ALT 14  AST 23  GLUCOSE 156*    Lipid Panel  No results found for: "CHOL", "HDL", "LDLCALC", "LDLDIRECT", "TRIG", "CHOLHDL"  BNP (last 3 results) No results for input(s): "BNP" in the last 8760 hours.  HEMOGLOBIN A1C Lab Results  Component Value Date   HGBA1C 7.8 (H) 11/04/2021   MPG 177.16 11/04/2021   Lactic Acid, Venous    Component Value Date/Time   LATICACIDVEN 2.6 (HH) 11/05/2021 0406   LATICACIDVEN 2.8 (HH) 11/04/2021 1246   LATICACIDVEN 5.2 (HH) 11/04/2021 0446    Cardiac Panel (last 3 results) Recent Labs    11/04/21 1724 11/04/21 2125 11/05/21 0443  TROPONINIHS 2,495* 1,853* 1,139*   TSH No results for input(s): "TSH" in the last 8760 hours.   CARDIAC DATABASE: EKG: 11/03/2021: Sinus tachycardia, 108 bpm, without underlying injury pattern.  11/04/2021: Normal sinus rhythm, 90 bpm, without underlying ischemia injury  pattern.  11/04/2021: Sinus tachycardia, 102 bpm, without underlying ischemia injury pattern  11/05/2021: Normal sinus rhythm, 91 bpm, without underlying ischemia injury pattern.  Echocardiogram: 11/05/2021:  1. Left ventricular ejection fraction, by estimation, is 60 to 65%. The  left ventricle has normal function. The left ventricle has no regional  wall motion abnormalities. Left ventricular diastolic parameters were  normal.   2. Right ventricular systolic function is normal. The right ventricular  size is normal. There is normal pulmonary artery systolic pressure.   3. The mitral valve is normal in structure. Mild mitral valve  regurgitation. No evidence of mitral stenosis.   4. The aortic valve is tricuspid. Aortic valve regurgitation is not  visualized. No aortic stenosis is present.   5. The inferior vena cava is dilated in size with >50% respiratory  variability, suggesting right atrial pressure of 8 mmHg.   6. Rhythm strip during this exam demostrated normal sinus rhythm.   IMPRESSION & RECOMMENDATIONS: Jody Ball is a 51 y.o. Caucasian female whose past medical history and cardiovascular risk factors include: Non-insulin-dependent diabetes mellitus type 2, hypertension, hyperlipidemia.  Impression:  Elevated troponins likely secondary to supply demand ischemia type II MI Precordial pain Septic shock secondary to pyelonephritis: No longer on pressor support. DKA - resolved Lactic acidosis -likely secondary to long-term metformin use as  well as septic shock: improving Acute kidney injury-improving. Pyelonephritis-currently on antibiotics Non-insulin-dependent diabetes mellitus type 2. Hyperlipidemia. Hypertension  Plan:  Elevated troponin likely secondary to supply and demand ischemia / Precordial Pain: Likely due to multifactorial etiologies: Septic shock requiring pressor support, lactic acidosis which peaked at 8.1, she had a pH of 7.03, AKI with serum creatinine  peaking at 4.9 mg/dL, diabetic ketoacidosis.  Her high sensitive troponin peaked at 2495 and surface ECG does not illustrate underlying ischemia injury pattern.  Patient is also currently asymptomatic and does not have anginal discomfort.  Her septic shock has and DKA now resolved.  She still has residual lactic acidosis, hematuria likely due to pyelonephritis, and acute kidney injury.   In the setting of no anginal discomfort, ischemic/dynamic EKG changes, she has preserved LVEF with no regional wall motion abnormalities.  I would still recommend conservative management for now and improving her modifiable cardiovascular risk factors.  However, given her risk factors as mentioned above she would benefit from an ischemic evaluation once her acute illnesses and renal function has improved. Depending on her clinical course we can do this either a outpatient or prior to discharge.   Patient is agreeable with the plan of care.  She is aware to inform the nurse if she has anginal discomfort so that appropriate steps can be undertaken during this hospitalization.  She understands that contrast load with either coronary CTA or angiography in the current clinical setting predisposes her to contrast-induced nephropathy and worsening renal function.  Patient states that she will follow-up with PCP and cardiology once discharged for ischemic evaluation.  Continue aspirin 81 mg p.o. daily. We will start her on low-dose beta-blocker Toprol-XL 25 mg p.o. daily  NIDDM Type 2: per primary team.   HLD:  We will restart home dose of Lipitor 20 mg p.o. nightly - LFTs are within normal limits. Recheck fasting lipid profile and direct LDL.  Management of other comorbid conditions and active problem list to primary team.   Cardiology will sign off for now. Will follow her peripherally. Thank you for allowing [place] to participate in the care of Malvine Ackland please reach out if any questions or concerns arise.Please  call us back if change in clinical status or if questions arise.   Will arrange outpatient follow up as well.   Total encounter time 65 minutes. *Total Encounter Time as defined by the Centers for Medicare and Medicaid Services includes, in addition to the face-to-face time of a patient visit (documented in the note above) non-face-to-face time: obtaining and reviewing outside history, ordering and reviewing medications, tests or procedures, care coordination (communications with other health care professionals or caregivers) and documentation in the medical record.  Patient's questions and concerns were addressed to her satisfaction. She voices understanding of the instructions provided during this encounter.   This note was created using a voice recognition software as a result there may be grammatical errors inadvertently enclosed that do not reflect the nature of this encounter. Every attempt is made to correct such errors.  Mechele Claude [facility]  Pager: [phone] Office: [phone] 11/05/2021, 11:36 AM

## 2021-11-06 DIAGNOSIS — R6521 Severe sepsis with septic shock: Secondary | ICD-10-CM | POA: Diagnosis not present

## 2021-11-06 DIAGNOSIS — A419 Sepsis, unspecified organism: Secondary | ICD-10-CM | POA: Diagnosis not present

## 2021-11-06 DIAGNOSIS — N390 Urinary tract infection, site not specified: Secondary | ICD-10-CM

## 2021-11-06 LAB — LIPID PANEL
Cholesterol: 85 mg/dL (ref 0–200)
HDL: 21 mg/dL — ABNORMAL LOW (ref >40)
LDL Cholesterol: 29 mg/dL (ref 0–99)
Total CHOL/HDL Ratio: 4 RATIO
Triglycerides: 177 mg/dL — ABNORMAL HIGH (ref <150)
VLDL: 35 mg/dL (ref 0–40)

## 2021-11-06 LAB — GLUCOSE, CAPILLARY
Glucose-Capillary: 173 mg/dL — ABNORMAL HIGH (ref 70–99)
Glucose-Capillary: 207 mg/dL — ABNORMAL HIGH (ref 70–99)
Glucose-Capillary: 171 mg/dL — ABNORMAL HIGH (ref 70–99)
Glucose-Capillary: 205 mg/dL — ABNORMAL HIGH (ref 70–99)

## 2021-11-06 LAB — BASIC METABOLIC PANEL
Anion gap: 11 (ref 5–15)
BUN: 25 mg/dL — ABNORMAL HIGH (ref 6–20)
CO2: 22 mmol/L (ref 22–32)
Calcium: 7.8 mg/dL — ABNORMAL LOW (ref 8.9–10.3)
Chloride: 103 mmol/L (ref 98–111)
Creatinine, Ser: 1.37 mg/dL — ABNORMAL HIGH (ref 0.44–1.00)
GFR, Estimated: 46 mL/min — ABNORMAL LOW (ref >60)
Glucose, Bld: 175 mg/dL — ABNORMAL HIGH (ref 70–99)
Potassium: 3.4 mmol/L — ABNORMAL LOW (ref 3.5–5.1)
Sodium: 136 mmol/L (ref 135–145)

## 2021-11-06 LAB — CBC
HCT: 29.9 % — ABNORMAL LOW (ref 36.0–46.0)
Hemoglobin: 10 g/dL — ABNORMAL LOW (ref 12.0–15.0)
MCH: 31.8 pg (ref 26.0–34.0)
MCHC: 33.4 g/dL (ref 30.0–36.0)
MCV: 95.2 fL (ref 80.0–100.0)
Platelets: 199 10*3/uL (ref 150–400)
RBC: 3.14 MIL/uL — ABNORMAL LOW (ref 3.87–5.11)
RDW: 12.5 % (ref 11.5–15.5)
WBC: 7 10*3/uL (ref 4.0–10.5)
nRBC: 0 % (ref 0.0–0.2)

## 2021-11-06 LAB — TROPONIN I (HIGH SENSITIVITY): Troponin I (High Sensitivity): 498 ng/L (ref <18)

## 2021-11-06 LAB — LDL CHOLESTEROL, DIRECT: Direct LDL: 35.8 mg/dL (ref 0–99)

## 2021-11-06 LAB — MAGNESIUM: Magnesium: 2.1 mg/dL (ref 1.7–2.4)

## 2021-11-06 MED ORDER — POTASSIUM CHLORIDE CRYS ER 20 MEQ PO TBCR
40.0000 meq | EXTENDED_RELEASE_TABLET | Freq: Once | ORAL | Status: AC
  Administered 2021-11-06: 40 meq via ORAL
  Filled 2021-11-06: qty 2

## 2021-11-06 NOTE — Progress Notes (Signed)
  Transition of Care [facility]) Screening Note   Patient Details  Name: Jody Ball Date of Birth: [birth year]   [facility]) CM/SW Contact:    Lanier Clam, RN Phone Number: 11/06/2021, 10:03 AM    Transition of Care Department [facility]) has reviewed patient and no TOC needs have been identified at this time. We will continue to monitor patient advancement through interdisciplinary progression rounds. If new patient transition needs arise, please place a TOC consult.

## 2021-11-06 NOTE — Progress Notes (Signed)
Follow-up appointment has been scheduled for November 22, 2021 at 9 AM at [facility] cardiovascular.  Her discharge paperwork is updated as well to reflect the same.   Please call if any questions or concerns arise during her hospitalization.  Tessa Lerner, [place], [facility]  Pager: [phone] Office: [phone]

## 2021-11-06 NOTE — Progress Notes (Signed)
Inpatient Diabetes Program Recommendations  AACE/ADA: New Consensus Statement on Inpatient Glycemic Control (2015)  Target Ranges:  Prepandial:   less than 140 mg/dL      Peak postprandial:   less than 180 mg/dL (1-2 hours)      Critically ill patients:  140 - 180 mg/dL   Lab Results  Component Value Date   GLUCAP 207 (H) 11/06/2021   HGBA1C 7.8 (H) 11/04/2021    Review of Glycemic Control  Latest Reference Range & Units 11/05/21 21:31 11/06/21 08:10 11/06/21 11:46  Glucose-Capillary 70 - 99 mg/dL 214 (H) 173 (H) 207 (H)    Latest Reference Range & Units 11/06/21 02:54  GFR, Estimated >60 mL/min 46 (L)  (L): Data is abnormally lowDiabetes history: DM2 Outpatient Diabetes medications: Janumet 50-1 gm. bid Current orders for Inpatient glycemic control: Semglee 10 units, Novolog 0-9 units correction tid + hs 0-5 units    Inpatient Diabetes Program Recommendations:    Spoke with patient and husband regarding diabetes management.  Reviewed patient's current A1c of 7.8% and current glucose trends. Explained what a A1c is and what it measures. Also reviewed goal A1c with patient, importance of good glucose control @ home, and blood sugar goals. Reviewed patho of DM, possible need for insulin, role of pancreas, differences between long acting vs short acting vs glp, vascular changes, impact of infection on glucose trends and insulin needs and commorbidities.  Patient has a meter and testing supplies. Encouraged to increase frequency and reviewed when to call MD.  Denies drinking sugary beverages. Reviewed importance of CHO mindfulness.  Educated patient and spouse on insulin pen use at home. Reviewed contents of insulin flexpen starter kit. Reviewed all steps if insulin pen including attachment of needle, 2-unit air shot, dialing up dose, giving injection, removing needle, disposal of sharps, storage of unused insulin, disposal of insulin etc. Patient able to provide successful return  demonstration. Also reviewed troubleshooting with insulin pen.  With improving GFR, hopeful for oral therapy restart. Of note, patient cannot take SGLT2 due to frequent yeast infections.   Following.   Thanks, Bronson Curb, MSN, RNC-OB Diabetes Coordinator [phone] (8a-5p)

## 2021-11-06 NOTE — Progress Notes (Signed)
PROGRESS NOTE    Jody Ball  RSW:[id number] DOB: [birth year] DOA: 11/03/2021 PCP: [facility] (Inactive)    Brief Narrative:   51 year old female with past medical history hypertension, hyperlipidemia, non-insulin-dependent diabetes mellitus type 2 who presents to [facility] emergency department with complaints of malaise and fever. She was found to have pyelonephritis and DKA progressed to shock placed on pressors and transferred to ICU. Sepsis picture resolving.  Transferring to floor 6/19.  Assessment and Plan: * Septic shock due to urinary tract infection [facility]) Patient presenting with severe right-sided pyelonephritis in the setting of multiple SIRS criteria and evidence of organ dysfunction with acute kidney injury all concerning for sepsis with organ dysfunction Likely gram-negative organism as the cause but cultures thus far have been unrevealing Off levophed  Blood cultures and urine cultures obtained, antibiotic therapy will be deescalated based on these results: no growth thus far Remain on IV abx for now   Acute kidney injury (AKI) with acute tubular necrosis (ATN) (HCC) Patient exhibiting evidence of acute kidney injury secondary to severe volume depletion, sepsis and pyelonephritis Holding home regimen of hydrochlorothiazide and lisinopril. Strict input and output monitoring improving  Elevated troponins likely secondary to supply demand ischemia  -cardiology consulted -echo done -ASA/BB -resume Lipitor -probable outpatient follow up   Metabolic acidosis multifactorial with DKA, lactate, metformin use and sepsis   T2DM with hyperglycemia: DKA Substantial hyperglycemia in the setting of sepsis  Patient was initiated on insulin infusion but now will be changed to SQ insulin Hemoglobin A1C: 7.8 Diabetic coordinator consult   Hyponatremia Modest hyponatremia likely secondary to hyperglycemia  There may be an element of true  hyponatremia underlying this due to volume depletion  hydrating patient with intravenous isotonic fluids Daily labs  Essential hypertension Holding home regimen of oral antihypertensive therapy in the setting of hypotension As needed intravenous antihypertensives for markedly elevated blood pressure  Mixed diabetic hyperlipidemia associated with type 2 diabetes mellitus (HCC) Resume statin  Hypokalemia -replete   Anemia -unclear etiology -daily labs -transfuse for <7       DVT prophylaxis: Place and maintain sequential compression device Start: 11/04/21 1459    Code Status: Full Code Family Communication: at bedside  Disposition Plan:  Level of care: Telemetry Status is: Inpatient Remains inpatient appropriate because: needs IV abx    Consultants:  Cards (signed off) PCCM (signed off)   Subjective: Feeling better  Objective: Vitals:   11/06/21 0400 11/06/21 0426 11/06/21 0620 11/06/21 0800  BP: 117/79  118/77   Pulse: 87  86   Resp: 15  16   Temp:  97.8 F (36.6 C)  98.1 F (36.7 C)  TempSrc:  Oral  Oral  SpO2: 95%  95%   Weight:      Height:        Intake/Output Summary (Last 24 hours) at 11/06/2021 0929 Last data filed at 11/06/2021 0908 Gross per 24 hour  Intake 1229.53 ml  Output 925 ml  Net 304.53 ml   Filed Weights   11/03/21 1559 11/04/21 0033  Weight: 66.7 kg 71.1 kg    Examination:    General: Appearance:     Overweight female in no acute distress     Lungs:     respirations unlabored  Heart:    Normal heart rate.   MS:   All extremities are intact.   Neurologic:   Awake, alert      Data Reviewed: I have personally reviewed following labs and  imaging studies  CBC: Recent Labs  Lab 11/03/21 1631 11/04/21 0335 11/04/21 0756 11/05/21 0406 11/06/21 0254  WBC 9.6 19.1* 16.7* 8.9 7.0  NEUTROABS  --  16.2*  --   --   --   HGB 12.7 10.2* 10.8* 9.7* 10.0*  HCT 37.9 31.7* 32.9* 27.5* 29.9*  MCV 96.9 99.7 98.8 92.6 95.2   PLT 238 256 254 196 199   Basic Metabolic Panel: Recent Labs  Lab 11/04/21 0335 11/04/21 0714 11/04/21 0756 11/04/21 1246 11/04/21 1724 11/05/21 0406 11/05/21 1547 11/06/21 0254  NA 127*  --    < > 128* 131* 134* 135 136  K 3.4*  --    < > 3.4* 3.2* 3.0* 3.2* 3.4*  CL 80*  --    < > 93* 94* 98 101 103  CO2 18*  --    < > 13* 20* 26 25 22   GLUCOSE 252*  --    < > 196* 124* 156* 121* 175*  BUN 57*  --    < > 62* 61* 45* 33* 25*  CREATININE 4.19*  --    < > 4.50* 3.78* 2.69* 1.94* 1.37*  CALCIUM 6.7*  --    < > 7.0* 7.3* 7.6* 8.1* 7.8*  MG 2.2 2.3  --   --   --  2.4  --   --   PHOS  --  5.5*  --   --   --  2.5  --   --    < > = values in this interval not displayed.   GFR: Estimated Creatinine Clearance: 47.5 mL/min (A) (by C-G formula based on SCr of 1.37 mg/dL (H)). Liver Function Tests: Recent Labs  Lab 11/03/21 1631 11/04/21 0335 11/04/21 0756 11/05/21 0406  AST 30 21 24 23   ALT 25 17 17 14   ALKPHOS 66 51 56 44  BILITOT 0.5 1.3* 1.6* 0.5  PROT 7.0 4.8* 5.2* 4.8*  ALBUMIN 3.0* 2.0* 2.1* 1.9*   Recent Labs  Lab 11/03/21 1631  LIPASE 26   No results for input(s): "AMMONIA" in the last 168 hours. Coagulation Profile: Recent Labs  Lab 11/03/21 1651  INR 0.9   Cardiac Enzymes: No results for input(s): "CKTOTAL", "CKMB", "CKMBINDEX", "TROPONINI" in the last 168 hours. BNP (last 3 results) No results for input(s): "PROBNP" in the last 8760 hours. HbA1C: Recent Labs    11/04/21 0035  HGBA1C 7.8*   CBG: Recent Labs  Lab 11/05/21 0812 11/05/21 1156 11/05/21 1553 11/05/21 2131 11/06/21 0810  GLUCAP 161* 123* 153* 214* 173*   Lipid Profile: Recent Labs    11/06/21 0254  CHOL 85  HDL 21*  LDLCALC 29  TRIG 11/07/21*  CHOLHDL 4.0   Thyroid Function Tests: No results for input(s): "TSH", "T4TOTAL", "FREET4", "T3FREE", "THYROIDAB" in the last 72 hours. Anemia Panel: No results for input(s): "VITAMINB12", "FOLATE", "FERRITIN", "TIBC", "IRON",  "RETICCTPCT" in the last 72 hours. Sepsis Labs: Recent Labs  Lab 11/04/21 0035 11/04/21 0446 11/04/21 0714 11/04/21 1246 11/05/21 0406  PROCALCITON  --   --  24.47  --   --   LATICACIDVEN 8.1* 5.2*  --  2.8* 2.6*    Recent Results (from the past 240 hour(s))  Blood Culture (routine x 2)     Status: None (Preliminary result)   Collection Time: 11/03/21  4:53 PM   Specimen: BLOOD  Result Value Ref Range Status   Specimen Description   Final    BLOOD LEFT ANTECUBITAL Performed at [facility], [address], [place], [place] [address]    Special Requests   Final    BOTTLES DRAWN AEROBIC AND ANAEROBIC Blood Culture results may not be optimal due to an inadequate volume of blood received in culture bottles Performed at [facility], [address], [place], [place] [address]    Culture   Final    NO GROWTH 3 DAYS Performed at [facility], [address], [place], [place] [address]    Report Status PENDING  Incomplete  Blood Culture (routine x 2)     Status: None (Preliminary result)   Collection Time: 11/03/21  6:00 PM   Specimen: BLOOD  Result Value Ref Range Status   Specimen Description   Final    BLOOD LEFT ANTECUBITAL Performed at [facility], [address], [place], [place] [address]    Special Requests   Final    BOTTLES DRAWN AEROBIC AND ANAEROBIC Blood Culture results may not be optimal due to an inadequate volume of blood received in culture bottles Performed at [facility], [address] [place], [place] [address]    Culture   Final    NO GROWTH 3 DAYS Performed at [facility], [address] [place], [place] [address]    Report Status PENDING  Incomplete  Urine Culture     Status: None   Collection Time: 11/03/21  8:21 PM   Specimen: In/Out Cath Urine  Result Value Ref Range Status   Specimen Description   Final    IN/OUT CATH URINE Performed at [facility], [address] [place], [place] [address]    Special Requests   Final    NONE Performed at [facility], [address] [place], [place] [address]    Culture   Final    NO GROWTH Performed at [facility], [address] [place], [place] [address]    Report Status 11/05/2021 FINAL  Final  MRSA Next Gen by PCR, Nasal     Status: None   Collection Time: 11/04/21 12:25 AM   Specimen: Nasal Mucosa; Nasal Swab  Result Value Ref Range Status   MRSA by PCR Next Gen NOT DETECTED NOT DETECTED Final    Comment: (NOTE) The GeneXpert MRSA Assay (FDA approved for NASAL specimens only), is one component of a comprehensive MRSA colonization surveillance program. It is not intended to diagnose MRSA infection nor to guide or monitor treatment for MRSA infections. Test performance is not FDA approved in patients less than 26 years old. Performed at [facility], [address], [place], [place] [address]   Urine Culture     Status: None (Preliminary result)   Collection Time: 11/04/21 10:47 AM   Specimen: In/Out Cath Urine  Result Value Ref Range Status   Specimen Description   Final    IN/OUT CATH URINE Performed at [facility], [address], [place], [place] [address]    Special Requests   Final    NONE Performed at [facility], [address] [place], [place] [address]    Culture   Final    CULTURE REINCUBATED FOR BETTER GROWTH Performed at [facility], [address] [place], [place] [address]    Report Status PENDING  Incomplete         Radiology Studies: No results found.      Scheduled Meds:  aspirin  81  mg Oral Daily   atorvastatin  20 mg Oral QHS   Chlorhexidine Gluconate Cloth  6 each Topical Daily   feeding supplement  237 mL Oral BID BM   insulin aspart  0-5 Units Subcutaneous QHS   insulin aspart  0-9 Units Subcutaneous TID WC   magic  mouthwash  2 mL Oral TID   metoprolol succinate  25 mg Oral q AM   multivitamin with minerals  1 tablet Oral Daily   sodium chloride flush  3 mL Intravenous Q12H   Continuous Infusions:  sodium chloride     piperacillin-tazobactam (ZOSYN)  IV 3.375 g (11/06/21 0625)     LOS: 3 days    Time spent: 45 minutes spent on chart review, discussion with nursing staff, consultants, updating family and interview/physical exam; more than 50% of that time was spent in counseling and/or coordination of care.    Joseph Art, DO [facility] Available via Epic secure chat 7am-7pm After these hours, please refer to coverage provider listed on [email or web address] 11/06/2021, 9:29 AM

## 2021-11-07 ENCOUNTER — Telehealth: Payer: Self-pay

## 2021-11-07 DIAGNOSIS — A419 Sepsis, unspecified organism: Secondary | ICD-10-CM | POA: Diagnosis not present

## 2021-11-07 DIAGNOSIS — N390 Urinary tract infection, site not specified: Secondary | ICD-10-CM | POA: Diagnosis not present

## 2021-11-07 DIAGNOSIS — R6521 Severe sepsis with septic shock: Secondary | ICD-10-CM | POA: Diagnosis not present

## 2021-11-07 LAB — CBC
HCT: 30.7 % — ABNORMAL LOW (ref 36.0–46.0)
Hemoglobin: 10.3 g/dL — ABNORMAL LOW (ref 12.0–15.0)
MCH: 32.2 pg (ref 26.0–34.0)
MCHC: 33.6 g/dL (ref 30.0–36.0)
MCV: 95.9 fL (ref 80.0–100.0)
Platelets: 224 10*3/uL (ref 150–400)
RBC: 3.2 MIL/uL — ABNORMAL LOW (ref 3.87–5.11)
RDW: 12.5 % (ref 11.5–15.5)
WBC: 6.9 10*3/uL (ref 4.0–10.5)
nRBC: 0 % (ref 0.0–0.2)

## 2021-11-07 LAB — BASIC METABOLIC PANEL
Anion gap: 7 (ref 5–15)
BUN: 13 mg/dL (ref 6–20)
CO2: 23 mmol/L (ref 22–32)
Calcium: 8.1 mg/dL — ABNORMAL LOW (ref 8.9–10.3)
Chloride: 109 mmol/L (ref 98–111)
Creatinine, Ser: 0.98 mg/dL (ref 0.44–1.00)
GFR, Estimated: >60 mL/min (ref >60)
Glucose, Bld: 123 mg/dL — ABNORMAL HIGH (ref 70–99)
Potassium: 3.9 mmol/L (ref 3.5–5.1)
Sodium: 139 mmol/L (ref 135–145)

## 2021-11-07 LAB — GLUCOSE, CAPILLARY
Glucose-Capillary: 170 mg/dL — ABNORMAL HIGH (ref 70–99)
Glucose-Capillary: 168 mg/dL — ABNORMAL HIGH (ref 70–99)

## 2021-11-07 LAB — URINE CULTURE: Culture: 50000 — AB

## 2021-11-07 MED ORDER — ASPIRIN 81 MG PO CHEW: 81.0000 mg | CHEWABLE_TABLET | Freq: Every day | ORAL | Status: AC

## 2021-11-07 MED ORDER — SITAGLIPTIN PHOSPHATE 100 MG PO TABS: 100.0000 mg | ORAL_TABLET | Freq: Every day | ORAL | 0 refills | Status: AC

## 2021-11-07 MED ORDER — HYDROCODONE-ACETAMINOPHEN 5-325 MG PO TABS: 1.0000 | ORAL_TABLET | ORAL | 0 refills | Status: DC | PRN

## 2021-11-07 MED ORDER — FLUCONAZOLE 100 MG PO TABS
100.0000 mg | ORAL_TABLET | Freq: Every day | ORAL | Status: DC
  Administered 2021-11-07: 100 mg via ORAL
  Filled 2021-11-07: qty 1

## 2021-11-07 MED ORDER — AMOXICILLIN 500 MG PO CAPS: 500.0000 mg | ORAL_CAPSULE | Freq: Three times a day (TID) | ORAL | 0 refills | Status: DC

## 2021-11-07 MED ORDER — METOPROLOL SUCCINATE ER 25 MG PO TB24: 25.0000 mg | ORAL_TABLET | Freq: Every morning | ORAL | 0 refills | Status: DC

## 2021-11-07 MED ORDER — OXYCODONE-ACETAMINOPHEN 5-325 MG PO TABS: 1.0000 | ORAL_TABLET | Freq: Four times a day (QID) | ORAL | 0 refills | Status: DC | PRN

## 2021-11-07 MED ORDER — OXYCODONE-ACETAMINOPHEN 7.5-325 MG PO TABS: 1.0000 | ORAL_TABLET | Freq: Four times a day (QID) | ORAL | 0 refills | Status: DC | PRN

## 2021-11-07 MED ORDER — OXYCODONE-ACETAMINOPHEN 5-325 MG PO TABS
1.0000 | ORAL_TABLET | Freq: Four times a day (QID) | ORAL | Status: DC | PRN
  Administered 2021-11-07: 2 via ORAL
  Filled 2021-11-07: qty 2

## 2021-11-07 MED ORDER — AMOXICILLIN 500 MG PO CAPS
500.0000 mg | ORAL_CAPSULE | Freq: Three times a day (TID) | ORAL | Status: DC
  Administered 2021-11-07: 500 mg via ORAL
  Filled 2021-11-07: qty 1

## 2021-11-07 MED ORDER — FLUCONAZOLE 100 MG PO TABS: 100.0000 mg | ORAL_TABLET | Freq: Every day | ORAL | 0 refills | Status: DC

## 2021-11-07 MED ORDER — LINAGLIPTIN 5 MG PO TABS
5.0000 mg | ORAL_TABLET | Freq: Every day | ORAL | Status: DC
  Administered 2021-11-07: 5 mg via ORAL
  Filled 2021-11-07: qty 1

## 2021-11-07 MED ORDER — SODIUM CHLORIDE 0.9 % IV SOLN
1.0000 g | INTRAVENOUS | Status: DC
  Filled 2021-11-07: qty 10

## 2021-11-07 NOTE — Telephone Encounter (Signed)
Pt needs TOC. Pt is currently still admitted.

## 2021-11-07 NOTE — Telephone Encounter (Signed)
Patient is still currently admitted in the hospital.

## 2021-11-07 NOTE — Discharge Summary (Addendum)
Physician Discharge Summary  Jody Ball UDJ:[id number] DOB: 09/11/1970 DOA: 11/03/2021  PCP: [facility] (Inactive)  Admit date: 11/03/2021 Discharge date: 11/07/2021  Admitted From: home Discharge disposition: home   Recommendations for Outpatient Follow-Up:   Finish treatment with abx Addition of amaryl 2 mg PO if patient consistently has blood sugars > 200 Cbc 1 week Outpatient cards follow up   Addendum: [facility] is out of [facility] pain medications so changed to norco.  Then patient wanted pain meds sent to [facility] so prescription changed.   Discharge Diagnosis:   Principal Problem:   Septic shock due to urinary tract infection (HCC) Active Problems:   Acute kidney injury (AKI) with acute tubular necrosis (ATN) (HCC)   Metabolic acidosis   Uncontrolled type 2 diabetes mellitus with hyperglycemia, without long-term current use of insulin (HCC)   Hyponatremia   Essential hypertension   Mixed diabetic hyperlipidemia associated with type 2 diabetes mellitus (HCC)    Discharge Condition: Improved.  Diet recommendation: Carbohydrate-modified.   Wound care: None.  Code status: Full.   History of Present Illness:   51 year old female with past medical history hypertension, hyperlipidemia, non-insulin-dependent diabetes mellitus type 2 who presents to [facility] emergency department with complaints of malaise and fever.   Patient is explained that on Sunday patient began to experience a feeling of generalized malaise and weakness.  Over the span of time the patient is also developing some degree of right flank pain, moderate to severe intensity, radiating down into the, worse with movement and improved with rest.  Patient is also complaining of associated and chills.  Due to progressively worsening symptoms patient mentioned primary care provider on Tuesday.  During that visit, her urinalysis was suggestive of urinary tract  infection and CT imaging of the abdomen pelvis revealed right-sided polynephritis.   Patient was administered a dose of intramuscular ceftriaxone at the time and sent home on oral antibiotics. Informed patient continue to worsen reports a fever of 104 F this morning.  Due to persistent fevers and worsening flank pain patient initially presented to [facility] emergency department for evaluation.   Hospital Course by Problem:   Septic shock due to urinary tract infection [facility]) Patient presenting with severe right-sided pyelonephritis in the setting of multiple SIRS criteria and evidence of organ dysfunction with acute kidney injury all concerning for sepsis with organ dysfunction Cultures: enterococcus faecalis  Off levophed  Blood cultures and urine cultures obtained, antibiotic therapy will be deescalated based on these results: amoxicillin     Acute kidney injury (AKI) with acute tubular necrosis (ATN) (HCC) Patient exhibiting evidence of acute kidney injury secondary to severe volume depletion, sepsis and pyelonephritis Resolved  Elevated troponins likely secondary to supply demand ischemia  -cardiology consulted -echo done -ASA/BB -resume Lipitor - outpatient follow up arranged     Metabolic acidosis multifactorial with DKA, lactate, metformin use and sepsis     T2DM with hyperglycemia: DKA Substantial hyperglycemia in the setting of sepsis  Patient was initiated on insulin infusion but now will be changed to SQ insulin Hemoglobin A1C: 7.8 Diabetic coordinator consult: resume [place] and may need addition of amaryl if blood sugars consistently high     Hyponatremia Modest hyponatremia likely secondary to hyperglycemia  There may be an element of true hyponatremia underlying this due to volume depletion  hydrating patient with intravenous isotonic fluids Daily labs   Essential hypertension Added BB Hold HCTZ   Mixed diabetic  hyperlipidemia associated with  type 2 diabetes mellitus (HCC) Resume statin   Hypokalemia -repleted   Anemia -unclear etiology -outpatient follow up      Medical Consultants:   Cards PCCM   Discharge Exam:   Vitals:   11/07/21 1035 11/07/21 1410  BP: (!) 136/94 118/77  Pulse: 66 72  Resp: 18 18  Temp:  98.2 F (36.8 C)  SpO2: 99% 97%   Vitals:   11/07/21 0930 11/07/21 1000 11/07/21 1035 11/07/21 1410  BP: 122/82  (!) 136/94 118/77  Pulse: 72 67 66 72  Resp: 18 16 18 18   Temp:    98.2 F (36.8 C)  TempSrc:    Oral  SpO2: 99% 98% 99% 97%  Weight:      Height:        General exam: Appears calm and comfortable.    The results of significant diagnostics from this hospitalization (including imaging, microbiology, ancillary and laboratory) are listed below for reference.     Procedures and Diagnostic Studies:   ECHOCARDIOGRAM COMPLETE  Result Date: 11/04/2021    ECHOCARDIOGRAM REPORT   Patient Name:   Jody Ball Date of Exam: 11/04/2021 Medical Rec #:  11/06/2021    Height:       65.0 in Accession #:    [id number]   Weight:       156.7 lb Date of Birth:  Nov 19, 1970     BSA:          1.784 m Patient Age:    51 years     BP:           121/66 mmHg Patient Gender: F            HR:           86 bpm. Exam Location:  Inpatient Procedure: 2D Echo, Cardiac Doppler and Color Doppler Indications:     diabetic ketoacidosis  History:         Patient has prior history of Echocardiogram examinations, most                  recent 04/22/2009. Signs/Symptoms:Chest pain - non specifiic;                  Risk Factors:Hypertension, Diabetes and Dyslipidemia.  Sonographer:     14/07/2008 RDCS Referring Phys:  Leta Jungling [facility] RAMASWAMY Diagnosing Phys: [facility] DO  Sonographer Comments: Suboptimal apical window. Limited movement due to left central line. IMPRESSIONS  1. Left ventricular ejection fraction, by estimation, is 60 to 65%. The left ventricle has normal function. The left ventricle has no regional wall motion  abnormalities. Left ventricular diastolic parameters were normal.  2. Right ventricular systolic function is normal. The right ventricular size is normal. There is normal pulmonary artery systolic pressure.  3. The mitral valve is normal in structure. Mild mitral valve regurgitation. No evidence of mitral stenosis.  4. The aortic valve is tricuspid. Aortic valve regurgitation is not visualized. No aortic stenosis is present.  5. The inferior vena cava is dilated in size with >50% respiratory variability, suggesting right atrial pressure of 8 mmHg.  6. Rhythm strip during this exam demostrated normal sinus rhythm. Comparison(s): No prior Echocardiogram. FINDINGS  Left Ventricle: Left ventricular ejection fraction, by estimation, is 60 to 65%. The left ventricle has normal function. The left ventricle has no regional wall motion abnormalities. The left ventricular internal cavity size was normal in size. There is  no left ventricular hypertrophy. Left ventricular diastolic parameters  were normal. Right Ventricle: The right ventricular size is normal. No increase in right ventricular wall thickness. Right ventricular systolic function is normal. There is normal pulmonary artery systolic pressure. The tricuspid regurgitant velocity is 2.08 m/s, and  with an assumed right atrial pressure of 3 mmHg, the estimated right ventricular systolic pressure is 20.3 mmHg. Left Atrium: Left atrial size was normal in size. Right Atrium: Right atrial size was normal in size. Pericardium: There is no evidence of pericardial effusion. Mitral Valve: The mitral valve is normal in structure. Mild mitral valve regurgitation. No evidence of mitral valve stenosis. Tricuspid Valve: The tricuspid valve is normal in structure. Tricuspid valve regurgitation is mild . No evidence of tricuspid stenosis. Aortic Valve: The aortic valve is tricuspid. Aortic valve regurgitation is not visualized. No aortic stenosis is present. Pulmonic Valve: The  pulmonic valve was not well visualized. Pulmonic valve regurgitation is not visualized. No evidence of pulmonic stenosis. Aorta: The aortic root is normal in size and structure and the aortic root and ascending aorta are structurally normal, with no evidence of dilitation. Venous: The inferior vena cava is dilated in size with greater than 50% respiratory variability, suggesting right atrial pressure of 8 mmHg. IAS/Shunts: The interatrial septum was not well visualized. EKG: Rhythm strip during this exam demostrated normal sinus rhythm.  LEFT VENTRICLE PLAX 2D LVIDd:         4.10 cm   Diastology LVIDs:         2.70 cm   LV e' medial:    8.05 cm/s LV PW:         0.90 cm   LV E/e' medial:  13.4 LV IVS:        0.90 cm   LV e' lateral:   10.90 cm/s LVOT diam:     2.00 cm   LV E/e' lateral: 9.9 LV SV:         52 LV SV Index:   29 LVOT Area:     3.14 cm  RIGHT VENTRICLE             IVC RV S prime:     14.60 cm/s  IVC diam: 2.21 cm TAPSE (M-mode): 2.7 cm LEFT ATRIUM             Index        RIGHT ATRIUM           Index LA diam:        3.00 cm 1.68 cm/m   RA Area:     12.20 cm LA Vol (A2C):   27.9 ml 15.64 ml/m  RA Volume:   25.30 ml  14.19 ml/m LA Vol (A4C):   27.4 ml 15.36 ml/m LA Biplane Vol: 29.6 ml 16.60 ml/m  AORTIC VALVE LVOT Vmax:   85.50 cm/s LVOT Vmean:  55.700 cm/s LVOT VTI:    0.167 m  AORTA Ao Root diam: 2.90 cm Ao Asc diam:  2.80 cm MITRAL VALVE                TRICUSPID VALVE MV Area (PHT): 5.31 cm     TR Peak grad:   17.3 mmHg MV Decel Time: 143 msec     TR Vmax:        208.00 cm/s MV E velocity: 108.00 cm/s MV A velocity: 62.10 cm/s   SHUNTS MV E/A ratio:  1.74         Systemic VTI:  0.17 m  Systemic Diam: 2.00 cm Sunit Tolia DO Electronically signed by Tessa Lerner DO Signature Date/Time: 11/04/2021/10:54:11 AM    Final    US RENAL  Result Date: 11/04/2021 CLINICAL DATA:  AK I EXAM: RENAL / URINARY TRACT ULTRASOUND COMPLETE COMPARISON:  CT abdomen and pelvis dated November 03, 2021 FINDINGS: Right Kidney: Renal measurements: 11.6 x 5.1 x 5.6 cm = volume: 735 mL. Echogenicity within normal limits. No mass or hydronephrosis visualized. Left Kidney: Renal measurements: 11.4 x 5.1 x 5.1 cm = volume: 156 mL. Echogenicity within normal limits. No mass or hydronephrosis visualized. Bladder: Appears normal for degree of bladder distention. Bilateral jets visualized. Other: None. IMPRESSION: No hydronephrosis or nephrolithiasis. Electronically Signed   By: Allegra Lai M.D.   On: 11/04/2021 08:53   DG Chest Portable 1 View  Result Date: 11/04/2021 CLINICAL DATA:  51 year old female line placement. EXAM: PORTABLE CHEST 1 VIEW COMPARISON:  Portable chest 11/03/2021. FINDINGS: Portable AP semi upright view at 0430 hours. Left IJ approach central line placed, tip is at the cavoatrial junction level. Mildly lower lung volumes. No pneumothorax. Normal cardiac size and mediastinal contours. Visualized tracheal air column is within normal limits. Allowing for portable technique the lungs are clear. No acute osseous abnormality identified. IMPRESSION: 1. Left IJ central line placed, tip at the cavoatrial junction level and no adverse features. 2. No acute cardiopulmonary abnormality. Electronically Signed   By: Odessa Fleming M.D.   On: 11/04/2021 04:56   CT ABDOMEN PELVIS WO CONTRAST  Result Date: 11/03/2021 CLINICAL DATA:  Urinary tract infection. Fever. Hypotension. Change in bowel habits. EXAM: CT ABDOMEN AND PELVIS WITHOUT CONTRAST TECHNIQUE: Multidetector CT imaging of the abdomen and pelvis was performed following the standard protocol without IV contrast. RADIATION DOSE REDUCTION: This exam was performed according to the departmental dose-optimization program which includes automated exposure control, adjustment of the mA and/or kV according to patient size and/or use of iterative reconstruction technique. COMPARISON:  None Available. FINDINGS: Lower chest: No acute findings.  Hepatobiliary: No mass visualized on this unenhanced exam. Contrast material is noted within the gallbladder which is otherwise unremarkable in appearance. No evidence of biliary ductal dilatation. Pancreas: No mass or inflammatory process visualized on this unenhanced exam. Spleen:  Within normal limits in size. Adrenals/Urinary tract: Right renal swelling is seen with numerous areas of decreased parenchymal enhancement and mild perinephric soft tissue stranding. These findings are consistent with severe pyelonephritis. No renal abscess identified. No evidence of ureteral calculi or hydronephrosis. Unremarkable urinary bladder. Stomach/Bowel: No evidence of obstruction, inflammatory process, or abnormal fluid collections. Vascular/Lymphatic: No pathologically enlarged lymph nodes identified. No evidence of abdominal aortic aneurysm. Reproductive:  No mass or other significant abnormality. Other:  None. Musculoskeletal:  No suspicious bone lesions identified. IMPRESSION: Severe right pyelonephritis. No evidence of renal abscess, ureteral calculus, or hydronephrosis. Electronically Signed   By: Danae Orleans M.D.   On: 11/03/2021 18:54   DG Chest Port 1 View  Result Date: 11/03/2021 CLINICAL DATA:  Possible sepsis EXAM: PORTABLE CHEST 1 VIEW COMPARISON:  09/24/2017 FINDINGS: The heart size and mediastinal contours are within normal limits. Both lungs are clear. The visualized skeletal structures are unremarkable. IMPRESSION: No active disease. Electronically Signed   By: Ernie Avena M.D.   On: 11/03/2021 16:51     Labs:   Basic Metabolic Panel: Recent Labs  Lab 11/04/21 0335 11/04/21 7829 11/04/21 0756 11/04/21 1724 11/05/21 0406 11/05/21 1547 11/06/21 0254 11/06/21 0435 11/07/21 0320  NA 127*  --    < >  131* 134* 135 136  --  139  K 3.4*  --    < > 3.2* 3.0* 3.2* 3.4*  --  3.9  CL 80*  --    < > 94* 98 101 103  --  109  CO2 18*  --    < > 20* --  23  GLUCOSE 252*  --    < >  124* 156* 121* 175*  --  123*  BUN 57*  --    < > 61* 45* 33* 25*  --  13  CREATININE 4.19*  --    < > 3.78* 2.69* 1.94* 1.37*  --  0.98  CALCIUM 6.7*  --    < > 7.3* 7.6* 8.1* 7.8*  --  8.1*  MG 2.2 2.3  --   --  2.4  --   --  2.1  --   PHOS  --  5.5*  --   --  2.5  --   --   --   --    < > = values in this interval not displayed.   GFR Estimated Creatinine Clearance: 66.4 mL/min (by C-G formula based on SCr of 0.98 mg/dL). Liver Function Tests: Recent Labs  Lab 11/03/21 1631 11/04/21 0335 11/04/21 0756 11/05/21 0406  AST ALT ALKPHOS 66 51 56 44  BILITOT 0.5 1.3* 1.6* 0.5  PROT 7.0 4.8* 5.2* 4.8*  ALBUMIN 3.0* 2.0* 2.1* 1.9*   Recent Labs  Lab 11/03/21 1631  LIPASE 26   No results for input(s): "AMMONIA" in the last 168 hours. Coagulation profile Recent Labs  Lab 11/03/21 1651  INR 0.9    CBC: Recent Labs  Lab 11/04/21 0335 11/04/21 0756 11/05/21 0406 11/06/21 0254 11/07/21 0320  WBC 19.1* 16.7* 8.9 7.0 6.9  NEUTROABS 16.2*  --   --   --   --   HGB 10.2* 10.8* 9.7* 10.0* 10.3*  HCT 31.7* 32.9* 27.5* 29.9* 30.7*  MCV 99.7 98.8 92.6 95.2 95.9  PLT 256 254 196 199 224   Cardiac Enzymes: No results for input(s): "CKTOTAL", "CKMB", "CKMBINDEX", "TROPONINI" in the last 168 hours. BNP: Invalid input(s): "POCBNP" CBG: Recent Labs  Lab 11/06/21 1146 11/06/21 1633 11/06/21 2138 11/07/21 0758 11/07/21 1155  GLUCAP 207* 171* 205* 170* 168*   D-Dimer No results for input(s): "DDIMER" in the last 72 hours. Hgb A1c No results for input(s): "HGBA1C" in the last 72 hours. Lipid Profile Recent Labs    11/06/21 0254  CHOL 85  HDL 21*  LDLCALC 29  TRIG 161*  CHOLHDL 4.0  LDLDIRECT 35.8   Thyroid function studies No results for input(s): "TSH", "T4TOTAL", "T3FREE", "THYROIDAB" in the last 72 hours.  Invalid input(s): "FREET3" Anemia work up No results for input(s): "VITAMINB12", "FOLATE", "FERRITIN", "TIBC", "IRON",  "RETICCTPCT" in the last 72 hours. Microbiology Recent Results (from the past 240 hour(s))  Blood Culture (routine x 2)     Status: None (Preliminary result)   Collection Time: 11/03/21  4:53 PM   Specimen: BLOOD  Result Value Ref Range Status   Specimen Description   Final    BLOOD LEFT ANTECUBITAL Performed at [facility], [address], [place], [place] [address]    Special Requests   Final    BOTTLES DRAWN AEROBIC AND ANAEROBIC Blood Culture results may not be optimal due to an inadequate volume of blood received in culture bottles Performed at  [facility], [address] [place], [place] [address]    Culture   Final    NO GROWTH 4 DAYS Performed at [facility], [address], [place], [place] [address]    Report Status PENDING  Incomplete  Blood Culture (routine x 2)     Status: None (Preliminary result)   Collection Time: 11/03/21  6:00 PM   Specimen: BLOOD  Result Value Ref Range Status   Specimen Description   Final    BLOOD LEFT ANTECUBITAL Performed at [facility] [address], [place], [place] [address]    Special Requests   Final    BOTTLES DRAWN AEROBIC AND ANAEROBIC Blood Culture results may not be optimal due to an inadequate volume of blood received in culture bottles Performed at [facility], [address] [place], [place] [address]    Culture   Final    NO GROWTH 4 DAYS Performed at [facility], [address], [place], [place] [address]    Report Status PENDING  Incomplete  Urine Culture     Status: None   Collection Time: 11/03/21  8:21 PM   Specimen: In/Out Cath Urine  Result Value Ref Range Status   Specimen Description   Final    IN/OUT CATH URINE Performed at [facility], [address] [place], [place] [address]    Special Requests   Final    NONE Performed at [facility], [address]  [place], [place] [address]    Culture   Final    NO GROWTH Performed at [facility], [address] [place], [place] [address]    Report Status 11/05/2021 FINAL  Final  MRSA Next Gen by PCR, Nasal     Status: None   Collection Time: 11/04/21 12:25 AM   Specimen: Nasal Mucosa; Nasal Swab  Result Value Ref Range Status   MRSA by PCR Next Gen NOT DETECTED NOT DETECTED Final    Comment: (NOTE) The GeneXpert MRSA Assay (FDA approved for NASAL specimens only), is one component of a comprehensive MRSA colonization surveillance program. It is not intended to diagnose MRSA infection nor to guide or monitor treatment for MRSA infections. Test performance is not FDA approved in patients less than 62 years old. Performed at [facility], [address] [place], [place] [address]   Urine Culture     Status: Abnormal   Collection Time: 11/04/21 10:47 AM   Specimen: In/Out Cath Urine  Result Value Ref Range Status   Specimen Description   Final    IN/OUT CATH URINE Performed at [facility], [address] [place], [place] [address]    Special Requests   Final    NONE Performed at [facility], [address] [place], [place] [address]    Culture (A)  Final    50,000 COLONIES/mL ENTEROCOCCUS FAECALIS 60,000 COLONIES/mL YEAST    Report Status 11/07/2021 FINAL  Final   Organism ID, Bacteria ENTEROCOCCUS FAECALIS (A)  Final      Susceptibility   Enterococcus faecalis - MIC*    AMPICILLIN <=2 SENSITIVE Sensitive     NITROFURANTOIN <=16 SENSITIVE Sensitive     VANCOMYCIN 1 SENSITIVE Sensitive     * 50,000 COLONIES/mL ENTEROCOCCUS FAECALIS     Discharge Instructions:   Discharge Instructions     Diet Carb Modified   Complete by: As directed  Discharge instructions   Complete by: As directed    Check your blood sugar at least BID (once fasting and once 2 hours after a meal) -if your blood sugar is consistently above  200- -call your PCP as you will need to be started on another oral medication- perhaps amaryl   Increase activity slowly   Complete by: As directed       Allergies as of 11/07/2021   No Known Allergies      Medication List     STOP taking these medications    busPIRone 5 MG tablet Commonly known as: BUSPAR   ciprofloxacin 500 MG tablet Commonly known as: CIPRO   hydrochlorothiazide 25 MG tablet Commonly known as: HYDRODIURIL   Janumet 50-1000 MG tablet Generic drug: sitaGLIPtin-metformin   LORazepam 0.5 MG tablet Commonly known as: Ativan       TAKE these medications    acetaminophen 500 MG tablet Commonly known as: TYLENOL Take 1,000 mg by mouth every evening.   amoxicillin 500 MG capsule Commonly known as: AMOXIL Take 1 capsule (500 mg total) by mouth every 8 (eight) hours.   ASHWAGANDHA PO Take 1 tablet by mouth daily.   aspirin 81 MG chewable tablet Chew 1 tablet (81 mg total) by mouth daily. Start taking on: November 08, 2021   atorvastatin 20 MG tablet Commonly known as: LIPITOR Take 20 mg by mouth every evening.   fluconazole 100 MG tablet Commonly known as: DIFLUCAN Take 1 tablet (100 mg total) by mouth daily. Start taking on: November 08, 2021   fluticasone 50 MCG/ACT nasal spray Commonly known as: FLONASE Place 2 sprays into both nostrils 2 (two) times daily as needed for allergies or rhinitis.   lisinopril 20 MG tablet Commonly known as: ZESTRIL Take 20 mg by mouth every evening. What changed: Another medication with the same name was removed. Continue taking this medication, and follow the directions you see here.   metoprolol succinate 25 MG 24 hr tablet Commonly known as: TOPROL-XL Take 1 tablet (25 mg total) by mouth in the morning. Start taking on: November 08, 2021   ondansetron 4 MG tablet Commonly known as: ZOFRAN Take 4 mg by mouth every 8 (eight) hours as needed for nausea or vomiting.   oxyCODONE-acetaminophen 5-325 MG  tablet Commonly known as: PERCOCET/ROXICET Take 1-2 tablets by mouth every 6 (six) hours as needed for moderate pain.   sitaGLIPtin 100 MG tablet Commonly known as: Januvia Take 1 tablet (100 mg total) by mouth daily.   Sprintec 28 0.25-35 MG-MCG tablet Generic drug: norgestimate-ethinyl estradiol Take 1 tablet by mouth daily.        Follow-up Information     Tessa Lerner, DO Follow up on 11/22/2021.   Specialties: Cardiology, Vascular Surgery Why: 9am  To discuss cardiac work-up -status post hospitalization Please bring the caridac medication bottles at the next office visit to help facilitate a more accurate medication reconciliation. Contact information: [address] [place] [address] [phone]         [facility], [facility] Follow up in 1 week(s).   Contact information: [facility] [place] [address] [phone]                  Time coordinating discharge: 45 min  Signed:  Joseph Art DO  [facility] 11/07/2021, 2:13 PM

## 2021-11-07 NOTE — Progress Notes (Signed)
Discharge instructions given to patient and all questions were answered.  

## 2021-11-07 NOTE — Inpatient Diabetes Management (Signed)
Inpatient Diabetes Program Recommendations  AACE/ADA: New Consensus Statement on Inpatient Glycemic Control (2015)  Target Ranges:  Prepandial:   less than 140 mg/dL      Peak postprandial:   less than 180 mg/dL (1-2 hours)      Critically ill patients:  140 - 180 mg/dL    Latest Reference Range & Units 11/06/21 08:10 11/06/21 11:46 11/06/21 16:33 11/06/21 21:38  Glucose-Capillary 70 - 99 mg/dL 502 (H)  2 units Novolog  10 units Semglee @0907   207 (H)  3 units Novolog  171 (H)  2 units Novolog  205 (H)  2 units Novolog   (H): Data is abnormally high   Home DM Meds: Janumet 50-1000 mg bid  Current Orders: Novolog Sensitive Correction Scale/ SSI (0-9 units) TID AC + HS     MD- Note Semglee has been stopped.  Per conversation with MD, plan is to d/c home with oral diabetes meds  Recommend restart of the DPP-4 part of her home med [facility] has Tradjenta on formulary  If you would like to avoid Metformin for home, may consider addition of low dose Sulfonylurea like Amaryl 2 mg daily and have pt follow up with PCP    --Will follow patient during hospitalization--  [facility] RN, MSN, CDE Diabetes Coordinator Inpatient Glycemic Control Team Team Pager: [phone] (8a-5p)

## 2021-11-08 LAB — CULTURE, BLOOD (ROUTINE X 2)
Culture: NO GROWTH
Culture: NO GROWTH

## 2021-11-08 NOTE — Telephone Encounter (Signed)
Patient didn't answer, will try again tomorrow.

## 2021-11-09 LAB — LACTIC ACID, PLASMA: Lactic Acid, Venous: 2.6 mmol/L (ref 0.5–1.9)

## 2021-11-09 LAB — ETHANOL CONFIRM, URINE: Ethanol, Ur - Confirmation: 0.04 %

## 2021-11-09 LAB — URINE DRUGS OF ABUSE SCREEN W ALC, ROUTINE (REF LAB)
Amphetamines, Urine: NEGATIVE ng/mL
Barbiturate, Ur: NEGATIVE ng/mL
Benzodiazepine Quant, Ur: NEGATIVE ng/mL
Cannabinoid Quant, Ur: NEGATIVE ng/mL
Cocaine (Metab.): NEGATIVE ng/mL
Methadone Screen, Urine: NEGATIVE ng/mL
Phencyclidine, Ur: NEGATIVE ng/mL
Propoxyphene, Urine: NEGATIVE ng/mL

## 2021-11-09 LAB — OPIATES CONFIRMATION, URINE
CODEINE: NEGATIVE
MORPHINE: POSITIVE — AB
Morphine GC/MS Conf: 659 ng/mL
OPIATES: POSITIVE — AB

## 2021-11-09 NOTE — Telephone Encounter (Signed)
Have Saraii order labs prior to visit (24-48hr).   Tessa Lerner, [place], [facility] Pager: [phone] Office: [phone]

## 2021-11-10 ENCOUNTER — Other Ambulatory Visit: Payer: Self-pay

## 2021-11-10 DIAGNOSIS — R778 Other specified abnormalities of plasma proteins: Secondary | ICD-10-CM

## 2021-11-22 ENCOUNTER — Encounter: Payer: Self-pay | Admitting: Cardiology

## 2021-11-22 ENCOUNTER — Ambulatory Visit: Payer: BC Managed Care – PPO | Admitting: Cardiology

## 2021-11-22 VITALS — BP 125/86 | HR 79 | Temp 97.7°F | Ht 65.0 in | Wt 141.8 lb

## 2021-11-22 DIAGNOSIS — E1169 Type 2 diabetes mellitus with other specified complication: Secondary | ICD-10-CM

## 2021-11-22 DIAGNOSIS — Z8249 Family history of ischemic heart disease and other diseases of the circulatory system: Secondary | ICD-10-CM

## 2021-11-22 DIAGNOSIS — R778 Other specified abnormalities of plasma proteins: Secondary | ICD-10-CM

## 2021-11-22 DIAGNOSIS — R072 Precordial pain: Secondary | ICD-10-CM

## 2021-11-22 DIAGNOSIS — E1165 Type 2 diabetes mellitus with hyperglycemia: Secondary | ICD-10-CM

## 2021-11-22 DIAGNOSIS — R0609 Other forms of dyspnea: Secondary | ICD-10-CM

## 2021-11-22 DIAGNOSIS — I10 Essential (primary) hypertension: Secondary | ICD-10-CM

## 2021-11-22 NOTE — Progress Notes (Signed)
ID:  Jackqulyn Mendel, DOB [birth year], MRN [id number]  PCP:  Amil Amen, MD  Cardiologist:  Tessa Lerner, DO, [facility] (established care 11/05/2021)  Chief Complaint  Patient presents with   Chest Pain   New Patient (Initial Visit)   Hospitalization Follow-up    2 weeks    HPI  Jody Ball is a 51 y.o. Caucasian female whose past medical history and cardiovascular risk factors include: Hypertension, hyperlipidemia, non-insulin-dependent diabetes mellitus type 2, family history of heart disease.  Hospitalized in June 2023 for abdominal pain and back pain and was diagnosed with acute pyelonephritis with urinary tract infection and septic shock complicated by diabetic ketoacidosis.  Cardiology was consulted secondary to elevated troponins which peaked at 655 (high sensitive troponins) in the setting of acute kidney injury with a serum creatinine of 4.9 mg/dL.  It was felt that the high sensitive troponins are secondary to supply demand ischemia she did not have any anginal discomfort or dynamic EKG changes to warrant inpatient work-up as the risk of progressive CKD was high.  She was recommended to address any acute conditions and to follow-up as outpatient.  Patient presents today for follow-up.  Patient is accompanied by her brother was at today's office visit.  Patient provides verbal consent with having him present for today's office encounter.  Continues to have precordial pain, intermittent, present in the morning after taking her medications, substernally, intensity 6.5 out of 10, better after 60 minutes, no worsening factors, not always brought on by exertion, better with resting.  Associated symptoms includes feeling tired and fatigued.  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  FUNCTIONAL STATUS: No structured exercise program or daily routine.   ALLERGIES: No Known Allergies  MEDICATION LIST PRIOR TO  VISIT: Current Meds  Medication Sig   acetaminophen (TYLENOL) 500 MG tablet Take 1,000 mg by mouth every evening.   ASHWAGANDHA PO Take 1 tablet by mouth daily.   aspirin 81 MG chewable tablet Chew 1 tablet (81 mg total) by mouth daily.   atorvastatin (LIPITOR) 20 MG tablet Take 20 mg by mouth every evening.   CINNAMON PO Take 1,500 mg by mouth daily.   fluticasone (FLONASE) 50 MCG/ACT nasal spray Place 2 sprays into both nostrils 2 (two) times daily as needed for allergies or rhinitis.    HYDROcodone-acetaminophen (NORCO/VICODIN) 5-325 MG tablet Take 1 tablet by mouth every 4 (four) hours as needed for moderate pain.   lisinopril (ZESTRIL) 20 MG tablet Take 20 mg by mouth every evening.   NORGESTREL-ETHINYL ESTRADIOL PO Take by mouth.   sitaGLIPtin (JANUVIA) 100 MG tablet Take 1 tablet (100 mg total) by mouth daily.   vitamin B-12 (CYANOCOBALAMIN) 1000 MCG tablet Take 1,000 mcg by mouth daily.   [DISCONTINUED] metoprolol succinate (TOPROL-XL) 25 MG 24 hr tablet Take 1 tablet (25 mg total) by mouth in the morning.     PAST MEDICAL HISTORY: Past Medical History:  Diagnosis Date   Diabetes mellitus without complication (HCC)    High cholesterol    Hypertension     PAST SURGICAL HISTORY: Past Surgical History:  Procedure Laterality Date   BUNIONECTOMY Left    VARICOSE VEIN SURGERY Bilateral     FAMILY HISTORY: The patient family history includes Heart attack in her maternal grandfather and paternal grandfather.  SOCIAL HISTORY:  The patient  reports that she has never smoked. She has never used smokeless tobacco. She reports current alcohol use.  She reports that she does not use drugs.  REVIEW OF SYSTEMS: Review of Systems  Constitutional: Positive for malaise/fatigue.  Cardiovascular:  Positive for chest pain and dyspnea on exertion. Negative for leg swelling, near-syncope, orthopnea, palpitations, paroxysmal nocturnal dyspnea and syncope.  Respiratory:  Negative for shortness  of breath.   Musculoskeletal:  Positive for back pain.    PHYSICAL EXAM:    11/22/2021    9:03 AM 11/07/2021    2:10 PM 11/07/2021   10:35 AM  Vitals with BMI  Height 5\' 5"     Weight 141 lbs 13 oz    BMI 23.6    Systolic 125 118  Diastolic 86 77 94  Pulse 79 72 66    CONSTITUTIONAL: Well-developed and well-nourished. No acute distress.  SKIN: Skin is warm and dry. No rash noted. No cyanosis. No pallor. No jaundice HEAD: Normocephalic and atraumatic.  EYES: No scleral icterus MOUTH/THROAT: Moist oral membranes.  NECK: No JVD present. No thyromegaly noted. No carotid bruits  CHEST Normal respiratory effort. No intercostal retractions  LUNGS: Clear to auscultation bilaterally.  No stridor. No wheezes. No rales.  CARDIOVASCULAR: Regular rate and rhythm, positive S1-S2, no murmurs rubs or gallops appreciated. ABDOMINAL: Soft, nontender, nondistended, positive bowel sounds in all 4 quadrants, no apparent ascites.  EXTREMITIES: No peripheral edema, cool to touch, 1+ bilateral DP and PT pulses HEMATOLOGIC: No significant bruising NEUROLOGIC: Oriented to person, place, and time. Nonfocal. Normal muscle tone.  PSYCHIATRIC: Normal mood and affect. Normal behavior. Cooperative  CARDIAC DATABASE: EKG: 11/22/2021: Normal sinus rhythm, 69 bpm, negative precordial T waves, without underlying injury pattern.  Echocardiogram: November 05, 2021: LVEF 60 to 65%, no regional wall motion abnormalities, right ventricular size and function normal, mild MR, dilated IVC, estimated RAP 8 mmHg.  Stress Testing: No results found for this or any previous visit from the past 1095 days.  Heart Catheterization: None  LABORATORY DATA:    Latest Ref Rng & Units 11/07/2021    3:20 AM 11/06/2021    2:54 AM 11/05/2021    4:06 AM  CBC  WBC 4.0 - 10.5 K/uL 6.9  7.0  8.9   Hemoglobin 12.0 - 15.0 g/dL 11/07/2021  89.3  9.7   Hematocrit 36.0 - 46.0 % 30.7  29.9  27.5   Platelets 150 - 400 K/uL 224  199  196         Latest Ref Rng & Units 11/07/2021    3:20 AM 11/06/2021    2:54 AM 11/05/2021    3:47 PM  CMP  Glucose 70 - 99 mg/dL 11/07/2021  175  102   BUN 6 - 20 mg/dL 13  25  33   Creatinine 0.44 - 1.00 mg/dL 585  2.77  8.24   Sodium 135 - 145 mmol/L 139  136  135   Potassium 3.5 - 5.1 mmol/L 3.9  3.4  3.2   Chloride 98 - 111 mmol/L 109  103  101   CO2 22 - 32 mmol/L 23  22  25    Calcium 8.9 - 10.3 mg/dL 8.1  7.8  8.1     Lipid Panel     Component Value Date/Time   CHOL 85 11/06/2021 0254   TRIG 177 (H) 11/06/2021 0254   HDL 21 (L) 11/06/2021 0254   CHOLHDL 4.0 11/06/2021 0254   VLDL 35 11/06/2021 0254   LDLCALC 29 11/06/2021 0254   LDLDIRECT 35.8 11/06/2021 0254    No components found for: "NTPROBNP" No results  for input(s): "PROBNP" in the last 8760 hours. No results for input(s): "TSH" in the last 8760 hours.  BMP Recent Labs    11/05/21 1547 11/06/21 0254 11/07/21 0320  NA 135 136 139  K 3.2* 3.4* 3.9  CL 101 103 109  CO2 25 22 23   GLUCOSE 121* 175* 123*  BUN 33* 25* 13  CREATININE 1.94* 1.37* 0.98  CALCIUM 8.1* 7.8* 8.1*  GFRNONAA 31* 46* >60    HEMOGLOBIN A1C Lab Results  Component Value Date   HGBA1C 7.8 (H) 11/04/2021   MPG 177.16 11/04/2021    IMPRESSION:    ICD-10-CM   1. Precordial pain  R07.2 PCV MYOCARDIAL PERFUSION WO LEXISCAN    CT CARDIAC SCORING ([facility] LOCATIONS ONLY)    2. Dyspnea on exertion  R06.09     3. Elevated troponin  R77.8 EKG 12-Lead    CT CARDIAC SCORING ([facility] LOCATIONS ONLY)    4. Essential hypertension  I10 CT CARDIAC SCORING ([facility] LOCATIONS ONLY)    5. Type 2 diabetes mellitus with hyperglycemia, without long-term current use of insulin (HCC)  E11.65 CT CARDIAC SCORING ([facility] LOCATIONS ONLY)    6. Mixed diabetic hyperlipidemia associated with type 2 diabetes mellitus (HCC)  E11.69    E78.2     7. Family history of heart disease  Z82.49 PCV MYOCARDIAL PERFUSION WO LEXISCAN    CT CARDIAC SCORING ([facility] LOCATIONS ONLY)        RECOMMENDATIONS: Kaena Santori is a 51 y.o. Caucasian female whose past medical history and cardiac risk factors include: Hypertension, hyperlipidemia, non-insulin-dependent diabetes mellitus type 2, family history of heart disease.  Precordial pain / Dyspnea on exertion / Elevated troponin Symptoms suggestive of possible cardiac etiology.  Currently asymptomatic. EKG nonischemic. Recent echocardiogram during her last hospitalization notes preserved LVEF without any significant valvular heart disease or regional wall motion abnormalities. Patient continues to have precordial discomfort status post discharge.  We discussed undergoing either coronary CTA or nuclear stress test.  She still has some back pain and dysuria and to decrease contrast exposure and possible acute kidney injury we will proceed with stress testing.She will follow up w/ PCP regarding UTI symptoms.  Will hold off metoprolol since she is feeling tired and fatigue. Continue aspirin and statin therapy for now.  Essential hypertension Office blood pressures are well controlled. We will discontinue metoprolol she is feeling tired and fatigued as this may be contributory. Currently managed by primary care provider.  Type 2 diabetes mellitus with hyperglycemia, without long-term current use of insulin (HCC) Educated on the importance of glycemic control. Most recent hemoglobin A1c 7.8.  Currently on oral antiglycemic's and statin therapy.  As part of today's office visit reviewed the results of the recent hospitalization including echo report, EKGs, laboratory results and discharge summary independently and summarized above.  FINAL MEDICATION LIST END OF ENCOUNTER: No orders of the defined types were placed in this encounter.   Medications Discontinued During This Encounter  Medication Reason   fluconazole (DIFLUCAN) 100 MG tablet    amoxicillin (AMOXIL) 500 MG capsule    ondansetron (ZOFRAN) 4 MG tablet     oxyCODONE-acetaminophen (PERCOCET) 7.5-325 MG tablet    SPRINTEC 28 0.25-35 MG-MCG tablet    metoprolol succinate (TOPROL-XL) 25 MG 24 hr tablet Discontinued by provider     Current Outpatient Medications:    acetaminophen (TYLENOL) 500 MG tablet, Take 1,000 mg by mouth every evening., Disp: , Rfl:    ASHWAGANDHA PO, Take 1 tablet by mouth daily.,  Disp: , Rfl:    aspirin 81 MG chewable tablet, Chew 1 tablet (81 mg total) by mouth daily., Disp: , Rfl:    atorvastatin (LIPITOR) 20 MG tablet, Take 20 mg by mouth every evening., Disp: , Rfl: 1   CINNAMON PO, Take 1,500 mg by mouth daily., Disp: , Rfl:    fluticasone (FLONASE) 50 MCG/ACT nasal spray, Place 2 sprays into both nostrils 2 (two) times daily as needed for allergies or rhinitis. , Disp: , Rfl: 1   HYDROcodone-acetaminophen (NORCO/VICODIN) 5-325 MG tablet, Take 1 tablet by mouth every 4 (four) hours as needed for moderate pain., Disp: 20 tablet, Rfl: 0   lisinopril (ZESTRIL) 20 MG tablet, Take 20 mg by mouth every evening., Disp: , Rfl:    NORGESTREL-ETHINYL ESTRADIOL PO, Take by mouth., Disp: , Rfl:    sitaGLIPtin (JANUVIA) 100 MG tablet, Take 1 tablet (100 mg total) by mouth daily., Disp: 30 tablet, Rfl: 0   vitamin B-12 (CYANOCOBALAMIN) 1000 MCG tablet, Take 1,000 mcg by mouth daily., Disp: , Rfl:   Orders Placed This Encounter  Procedures   CT CARDIAC SCORING ([facility] LOCATIONS ONLY)   PCV MYOCARDIAL PERFUSION WO LEXISCAN   EKG 12-Lead    There are no Patient Instructions on file for this visit.   --Continue cardiac medications as reconciled in final medication list. --Return in about 6 weeks (around 01/03/2022) for Follow up, Chest pain. or sooner if needed. --Continue follow-up with your primary care physician regarding the management of your other chronic comorbid conditions.  Patient's questions and concerns were addressed to her satisfaction. She voices understanding of the instructions provided during this encounter.   This  note was created using a voice recognition software as a result there may be grammatical errors inadvertently enclosed that do not reflect the nature of this encounter. Every attempt is made to correct such errors.  Tessa Lerner, [place], [facility]  Pager: [phone] Office: [phone]

## 2021-11-29 ENCOUNTER — Ambulatory Visit: Payer: BC Managed Care – PPO

## 2021-11-29 DIAGNOSIS — Z8249 Family history of ischemic heart disease and other diseases of the circulatory system: Secondary | ICD-10-CM

## 2021-11-29 DIAGNOSIS — R072 Precordial pain: Secondary | ICD-10-CM

## 2022-01-04 ENCOUNTER — Ambulatory Visit: Payer: BC Managed Care – PPO | Admitting: Cardiology

## 2022-01-04 ENCOUNTER — Encounter: Payer: Self-pay | Admitting: Cardiology

## 2022-01-04 VITALS — BP 133/84 | HR 87 | Resp 16 | Ht 65.0 in | Wt 142.0 lb

## 2022-01-04 DIAGNOSIS — R0609 Other forms of dyspnea: Secondary | ICD-10-CM

## 2022-01-04 DIAGNOSIS — I10 Essential (primary) hypertension: Secondary | ICD-10-CM

## 2022-01-04 DIAGNOSIS — R072 Precordial pain: Secondary | ICD-10-CM

## 2022-01-04 DIAGNOSIS — E1165 Type 2 diabetes mellitus with hyperglycemia: Secondary | ICD-10-CM

## 2022-01-04 NOTE — Progress Notes (Signed)
ID:  Jody Ball, DOB 11-20-1970, MRN [id number]  PCP:  Amil Amen, MD  Cardiologist:  Tessa Lerner, DO, [facility] (established care 11/05/2021)  Chief Complaint  Patient presents with   Chest Pain   Follow-up    HPI  Jody Ball is a 51 y.o. Caucasian female whose past medical history and cardiovascular risk factors include: Hypertension, hyperlipidemia, non-insulin-dependent diabetes mellitus type 2, family history of heart disease.  In June 2023 patient was admitted for abdominal pain and back pain and was diagnosed with acute bilateral pyelonephritis, UTI, septic shock, and ketoacidosis.  Cardiology was consulted during that hospitalization due to elevated troponins.  Her high sensitive troponins peaked at 655 in the setting of acute kidney injury and a serum creatinine of 4.9 mg/dL.  At that time she did not have any anginal discomfort or dynamic EKG changes to warrant a more expedited cardiovascular work-up.  The shared decision was to follow her closely both hospitalization and once the kidney function resolves to undergo ischemic work-up or sooner if change in clinical status.  Patient establish care post hospitalization and underwent a coronary calcium score.  She was informed that her total coronary calcium score is 0 and her nuclear stress test is overall low risk.  Clinically her chest pain has essentially resolved.  She still has some shortness of breath with effort related activities but that too is gradually improving.  She is not in overt heart failure and no hospitalizations since last office visit.  Outside labs independently reviewed in Care Everywhere.  In July 2023 her A1c had slightly up trended.  She understands the importance of glycemic control.  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  FUNCTIONAL STATUS: No structured exercise program or daily routine.   ALLERGIES: No Known  Allergies  MEDICATION LIST PRIOR TO VISIT: Current Meds  Medication Sig   acetaminophen (TYLENOL) 500 MG tablet Take 1,000 mg by mouth every evening.   ASHWAGANDHA PO Take 1 tablet by mouth daily.   aspirin 81 MG chewable tablet Chew 1 tablet (81 mg total) by mouth daily.   atorvastatin (LIPITOR) 20 MG tablet Take 20 mg by mouth every evening.   busPIRone (BUSPAR) 5 MG tablet Take 5 mg by mouth 2 (two) times daily as needed.   CINNAMON PO Take 1,500 mg by mouth daily.   fluticasone (FLONASE) 50 MCG/ACT nasal spray Place 2 sprays into both nostrils 2 (two) times daily as needed for allergies or rhinitis.    lisinopril (ZESTRIL) 20 MG tablet Take 20 mg by mouth every evening.   NORGESTREL-ETHINYL ESTRADIOL PO Take by mouth.   sitaGLIPtin (JANUVIA) 100 MG tablet Take 1 tablet (100 mg total) by mouth daily.   vitamin B-12 (CYANOCOBALAMIN) 1000 MCG tablet Take 1,000 mcg by mouth daily.     PAST MEDICAL HISTORY: Past Medical History:  Diagnosis Date   Diabetes mellitus without complication (HCC)    High cholesterol    Hypertension     PAST SURGICAL HISTORY: Past Surgical History:  Procedure Laterality Date   BUNIONECTOMY Left    VARICOSE VEIN SURGERY Bilateral     FAMILY HISTORY: The patient family history includes Heart attack in her maternal grandfather and paternal grandfather.  SOCIAL HISTORY:  The patient  reports that she has never smoked. She has never used smokeless tobacco. She reports current alcohol use. She reports that she does not use drugs.  REVIEW OF SYSTEMS: Review of  Systems  Cardiovascular:  Positive for dyspnea on exertion (improved). Negative for chest pain, leg swelling, near-syncope, orthopnea, palpitations, paroxysmal nocturnal dyspnea and syncope.  Respiratory:  Negative for shortness of breath.   Musculoskeletal:  Positive for back pain.    PHYSICAL EXAM:    01/04/2022    9:10 AM 11/22/2021    9:03 AM 11/07/2021    2:10 PM  Vitals with BMI  Height  5\' 5"  5\' 5"    Weight 142 lbs 141 lbs 13 oz   BMI 23.63 23.6   Systolic 133 125  Diastolic 84 86 77  Pulse 87 79 72    CONSTITUTIONAL: Well-developed and well-nourished. No acute distress.  SKIN: Skin is warm and dry. No rash noted. No cyanosis. No pallor. No jaundice HEAD: Normocephalic and atraumatic.  EYES: No scleral icterus MOUTH/THROAT: Moist oral membranes.  NECK: No JVD present. No thyromegaly noted. No carotid bruits  CHEST Normal respiratory effort. No intercostal retractions  LUNGS: Clear to auscultation bilaterally.  No stridor. No wheezes. No rales.  CARDIOVASCULAR: Regular rate and rhythm, positive S1-S2, no murmurs rubs or gallops appreciated. ABDOMINAL: Soft, nontender, nondistended, positive bowel sounds in all 4 quadrants, no apparent ascites.  EXTREMITIES: No peripheral edema, cool to touch, 1+ bilateral DP and PT pulses HEMATOLOGIC: No significant bruising NEUROLOGIC: Oriented to person, place, and time. Nonfocal. Normal muscle tone.  PSYCHIATRIC: Normal mood and affect. Normal behavior. Cooperative  CARDIAC DATABASE: EKG: 11/22/2021: Normal sinus rhythm, 69 bpm, negative precordial T waves, without underlying injury pattern.  Echocardiogram: November 05, 2021: LVEF 60 to 65%, no regional wall motion abnormalities, right ventricular size and function normal, mild MR, dilated IVC, estimated RAP 8 mmHg.  Stress Testing: Exercise Sestamibi stress test 11/29/2021: Exercise nuclear stress test was performed using Bruce protocol.  1 Day Rest and Stress images. Exercise time 6 minutes 42 seconds, achieved 8.11 METS, 86% APMHR.  Stress ECG negative for ischemia. Small size, mild intensity, fixed perfusion defect basal inferoseptal and anteroseptal segments likely due to soft tissue attenuation artifact versus RV insertion site.  Otherwise, normal myocardial perfusion without convincing evidence of reversible myocardial ischemia or prior infarct. Left ventricular size  normal, wall thickness preserved, no regional wall motion abnormalities. LVEF 74%. Low risk study. No prior studies available for comparison.  Heart Catheterization: None  CT Cardiac Scoring: 12/04/2021 Total CAC 0 AU, 0th percentile for patient's age, sex, and race. Noncardiac findings: Unremarkable.  LABORATORY DATA:    Latest Ref Rng & Units 11/07/2021    3:20 AM 11/06/2021    2:54 AM 11/05/2021    4:06 AM  CBC  WBC 4.0 - 10.5 K/uL 6.9  7.0  8.9   Hemoglobin 12.0 - 15.0 g/dL 11/08/2021  11/07/2021  9.7   Hematocrit 36.0 - 46.0 % 30.7  29.9  27.5   Platelets 150 - 400 K/uL 224  199  196        Latest Ref Rng & Units 11/07/2021    3:20 AM 11/06/2021    2:54 AM 11/05/2021    3:47 PM  CMP  Glucose 70 - 99 mg/dL 11/08/2021  11/07/2021  433   BUN 6 - 20 mg/dL 13  25  33   Creatinine 0.44 - 1.00 mg/dL 295  188  4.16   Sodium 135 - 145 mmol/L 139  136  135   Potassium 3.5 - 5.1 mmol/L 3.9  3.4  3.2   Chloride 98 - 111 mmol/L 109  103  101  CO2 22 - 32 mmol/L 23  22  25    Calcium 8.9 - 10.3 mg/dL 8.1  7.8  8.1     Lipid Panel     Component Value Date/Time   CHOL 85 11/06/2021 0254   TRIG 177 (H) 11/06/2021 0254   HDL 21 (L) 11/06/2021 0254   CHOLHDL 4.0 11/06/2021 0254   VLDL 35 11/06/2021 0254   LDLCALC 29 11/06/2021 0254   LDLDIRECT 35.8 11/06/2021 0254    No components found for: "NTPROBNP" No results for input(s): "PROBNP" in the last 8760 hours. No results for input(s): "TSH" in the last 8760 hours.  BMP Recent Labs    11/05/21 1547 11/06/21 0254 11/07/21 0320  NA 135 136 139  K 3.2* 3.4* 3.9  CL 101 103 109  CO2 25 22 23   GLUCOSE 121* 175* 123*  BUN 33* 25* 13  CREATININE 1.94* 1.37* 0.98  CALCIUM 8.1* 7.8* 8.1*  GFRNONAA 31* 46* >60    HEMOGLOBIN A1C Lab Results  Component Value Date   HGBA1C 7.8 (H) 11/04/2021   MPG 177.16 11/04/2021    IMPRESSION:    ICD-10-CM   1. Precordial pain  R07.2     2. Dyspnea on exertion  R06.09     3. Essential hypertension   I10     4. Type 2 diabetes mellitus with hyperglycemia, without long-term current use of insulin (HCC)  E11.65        RECOMMENDATIONS: Azaylea Maves is a 51 y.o. Caucasian female whose past medical history and cardiac risk factors include: Hypertension, hyperlipidemia, non-insulin-dependent diabetes mellitus type 2, family history of heart disease.  Precordial pain / Dyspnea on exertion Precordial discomfort have essentially resolved since last office visit. Dyspnea on exertion is improving but not completely resolved. Has undergone ischemic work-up including coronary CTA and exercise nuclear stress test.  Total CAC 0 and exercise nuclear stress test low risk. Educated on importance of improving her modifiable cardiovascular risk factors. Encouraged her to increase physical activity as tolerated with a goal of 30 minutes a day 5 days a week. If she were to have any precordial discomfort to suggest angina pectoris would consider coronary CTA for further evaluation and management.  Patient and husband are agreeable with the plan of care for now we will continue medical management.  Essential hypertension Office blood pressures are well controlled. Medications reconciled. Hold off on metoprolol as her total coronary calcium score is 0 and stress MPI is overall low risk. Reemphasized importance of a low-salt diet.  Type 2 diabetes mellitus with hyperglycemia, without long-term current use of insulin (HCC) Hemoglobin A1c is slightly up trended since last visit. Patient states that she is working on improving her glycemic control. Continue ACE inhibitors, statin therapy.  I would like to see her back on an annual basis or sooner if change in clinical status.  Both husband and wife are agreeable with the plan of care.  FINAL MEDICATION LIST END OF ENCOUNTER: No orders of the defined types were placed in this encounter.   Medications Discontinued During This Encounter  Medication Reason    HYDROcodone-acetaminophen (NORCO/VICODIN) 5-325 MG tablet      Current Outpatient Medications:    acetaminophen (TYLENOL) 500 MG tablet, Take 1,000 mg by mouth every evening., Disp: , Rfl:    ASHWAGANDHA PO, Take 1 tablet by mouth daily., Disp: , Rfl:    aspirin 81 MG chewable tablet, Chew 1 tablet (81 mg total) by mouth daily., Disp: , Rfl:  atorvastatin (LIPITOR) 20 MG tablet, Take 20 mg by mouth every evening., Disp: , Rfl: 1   busPIRone (BUSPAR) 5 MG tablet, Take 5 mg by mouth 2 (two) times daily as needed., Disp: , Rfl:    CINNAMON PO, Take 1,500 mg by mouth daily., Disp: , Rfl:    fluticasone (FLONASE) 50 MCG/ACT nasal spray, Place 2 sprays into both nostrils 2 (two) times daily as needed for allergies or rhinitis. , Disp: , Rfl: 1   lisinopril (ZESTRIL) 20 MG tablet, Take 20 mg by mouth every evening., Disp: , Rfl:    NORGESTREL-ETHINYL ESTRADIOL PO, Take by mouth., Disp: , Rfl:    sitaGLIPtin (JANUVIA) 100 MG tablet, Take 1 tablet (100 mg total) by mouth daily., Disp: 30 tablet, Rfl: 0   vitamin B-12 (CYANOCOBALAMIN) 1000 MCG tablet, Take 1,000 mcg by mouth daily., Disp: , Rfl:   No orders of the defined types were placed in this encounter.   There are no Patient Instructions on file for this visit.   --Continue cardiac medications as reconciled in final medication list. --Return in about 1 year (around 01/05/2023) for Annual follow-up.Marland Kitchen or sooner if needed. --Continue follow-up with your primary care physician regarding the management of your other chronic comorbid conditions.  Patient's questions and concerns were addressed to her satisfaction. She voices understanding of the instructions provided during this encounter.   This note was created using a voice recognition software as a result there may be grammatical errors inadvertently enclosed that do not reflect the nature of this encounter. Every attempt is made to correct such errors.  Tessa Lerner, [place], [facility]  Pager:  [phone] Office: [phone]

## 2022-01-31 ENCOUNTER — Other Ambulatory Visit: Payer: BC Managed Care – PPO

## 2023-01-07 ENCOUNTER — Ambulatory Visit: Payer: BC Managed Care – PPO | Admitting: Cardiology

## 2023-02-24 IMAGING — US US RENAL
1 series · 15 of 25 positions shown · non-contrast
Comparison: CT abdomen and pelvis dated November 03, 2021

CLINICAL DATA: AK I

EXAM:
RENAL / URINARY TRACT ULTRASOUND COMPLETE

[Series 1: [facility] renal [facility] & wl · 15 of 43 slices shown]
[im 1/43]
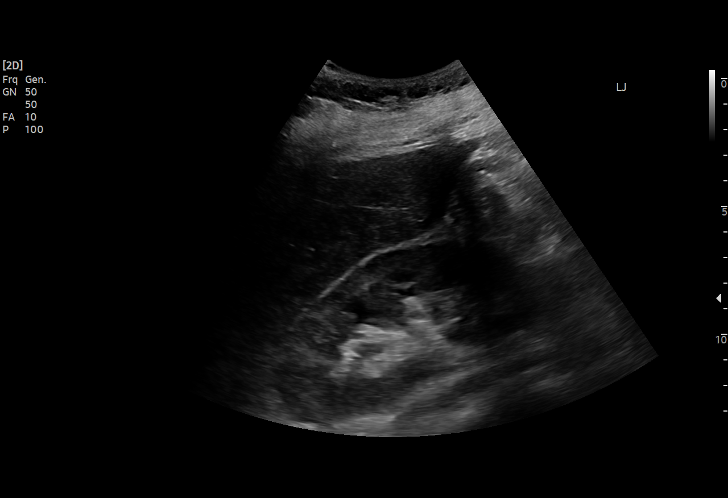
[im 4/43]
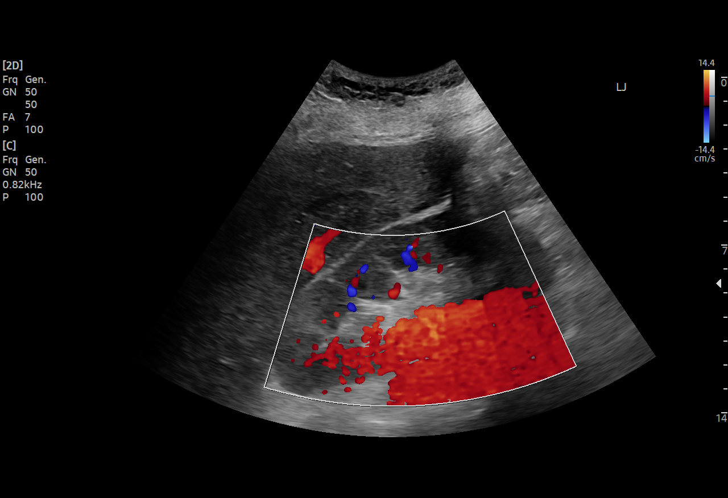
[im 8/43]
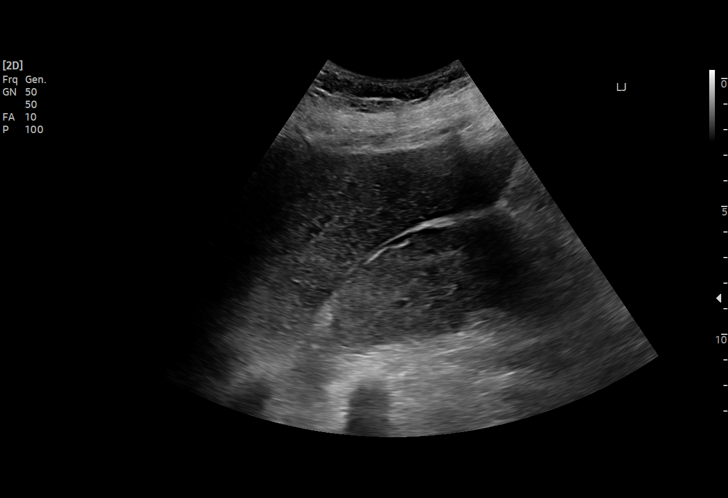
[im 9/43]
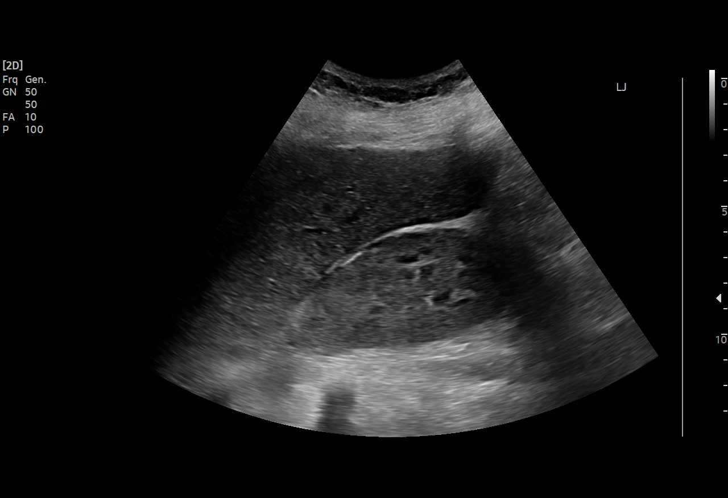
[im 13/43]
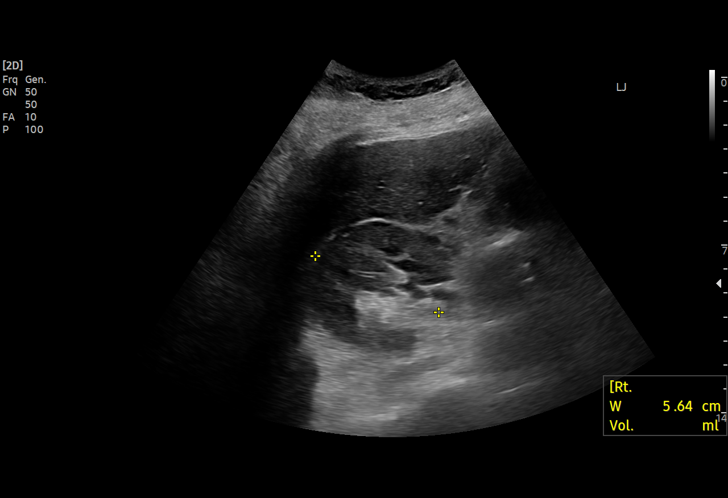
[im 16/43]
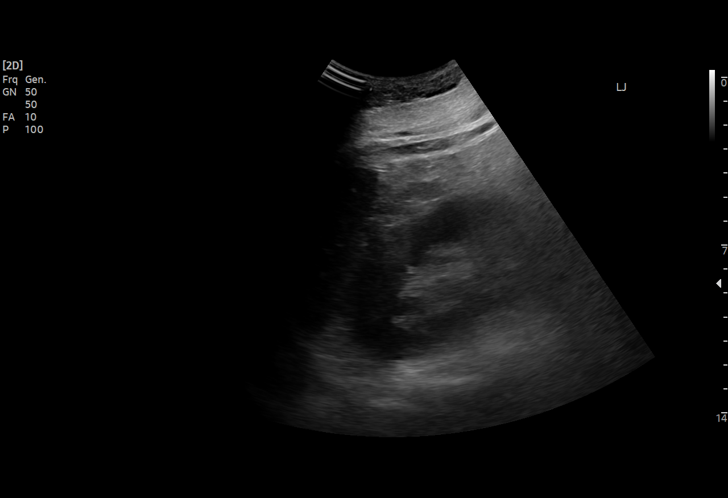
[im 18/43]
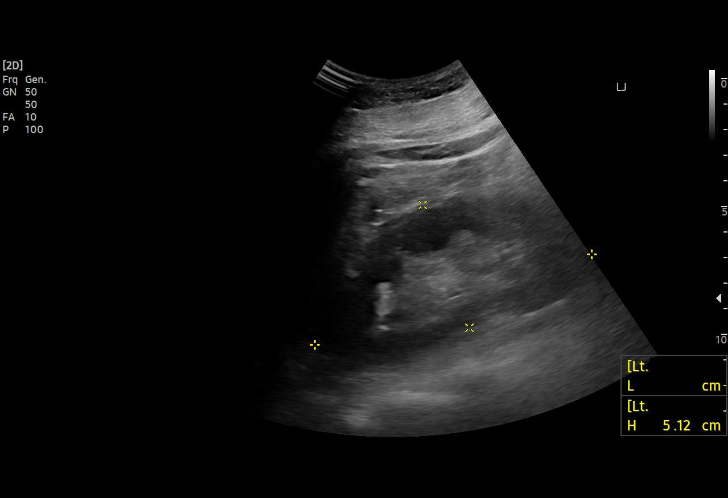
[im 22/43]
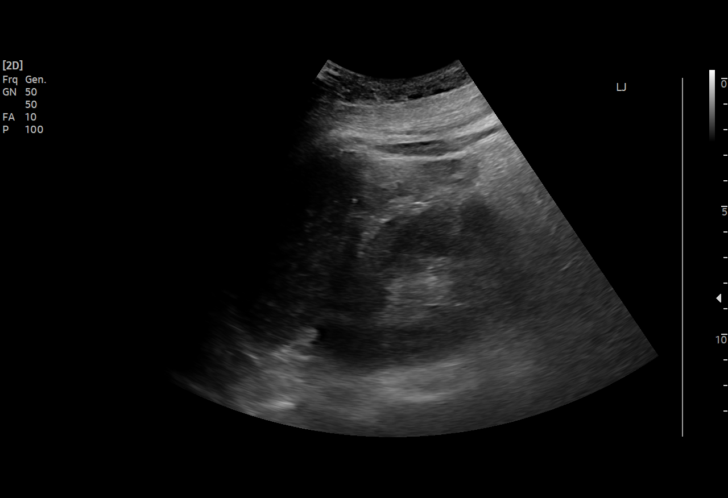
[im 25/43]
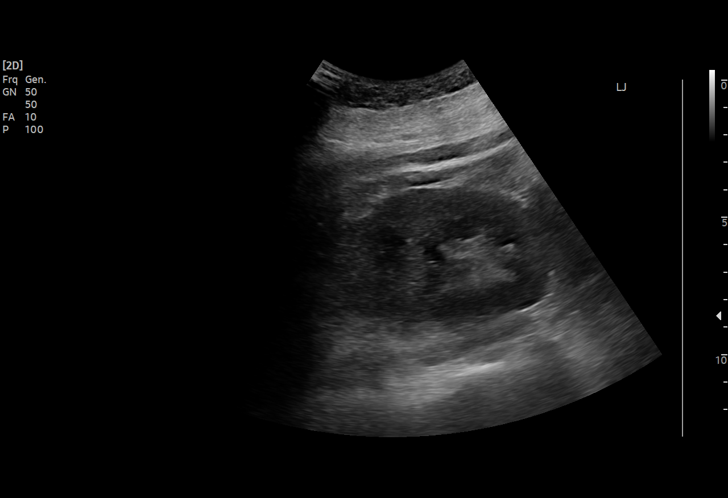
[im 27/43]
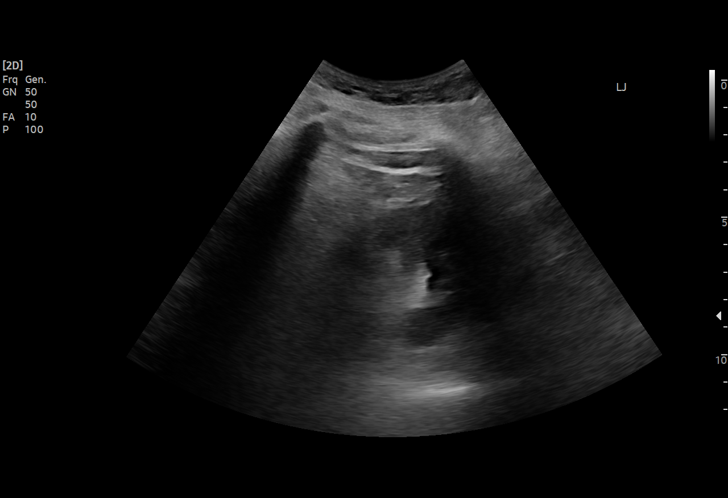
[im 30/43]
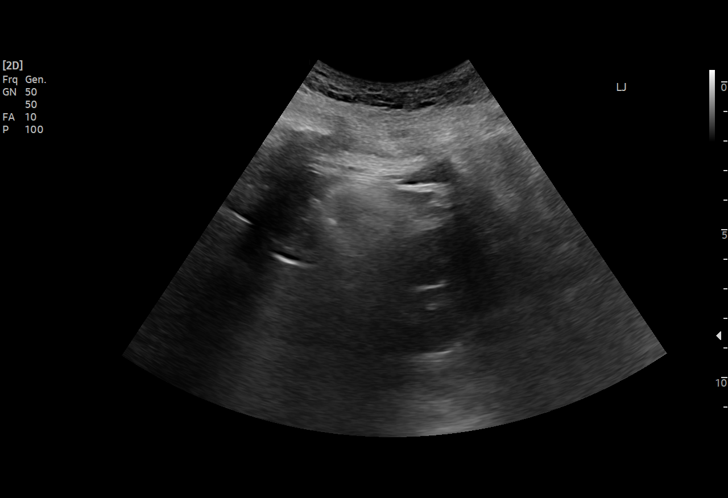
[im 34/43]
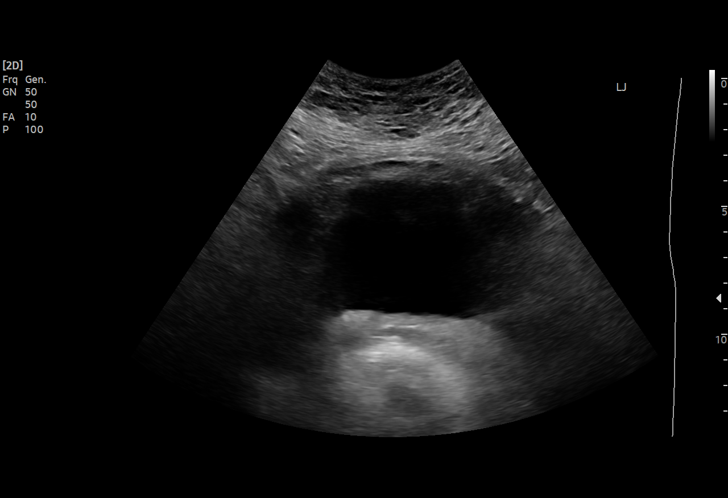
[im 36/43]
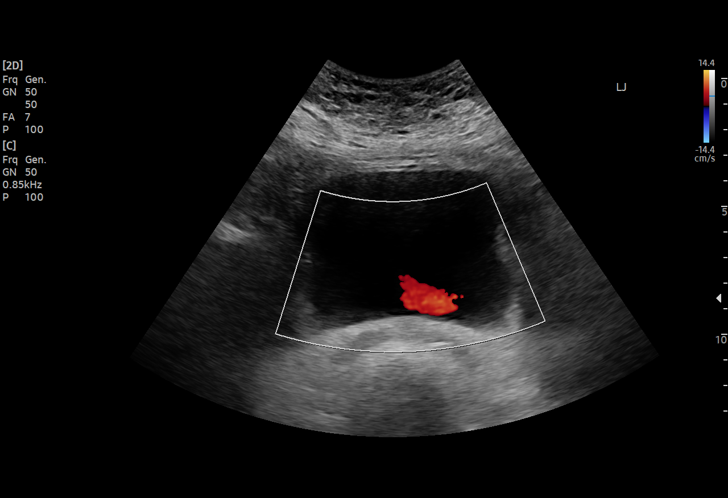
[im 39/43]
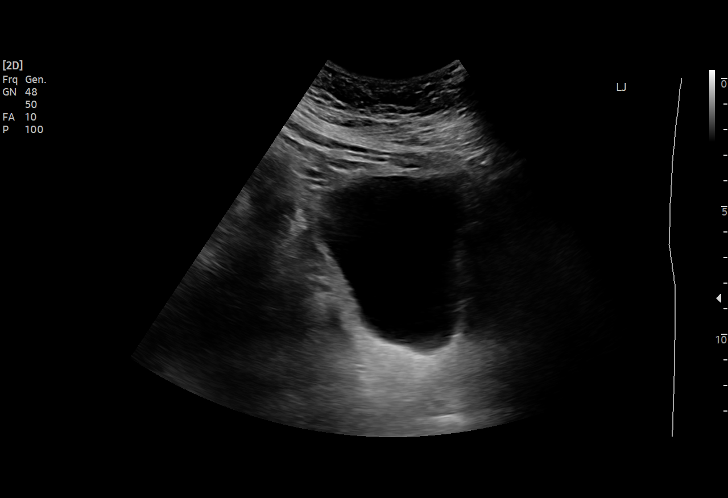
[im 43/43]
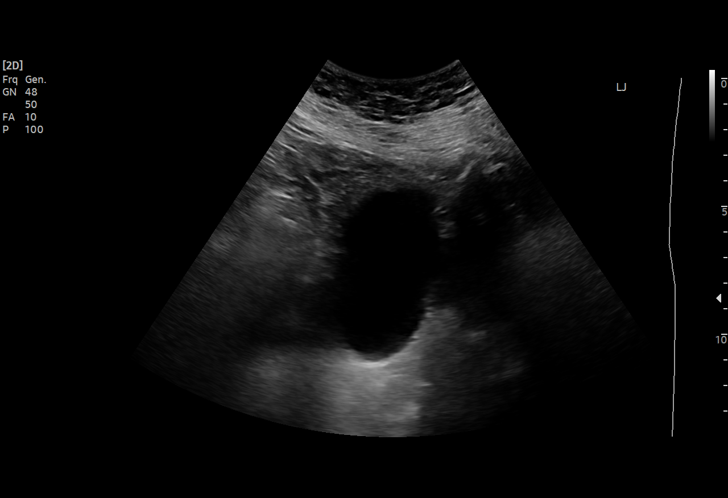

[15 of 25 positions shown; findings below may reference images not displayed]

FINDINGS: Right Kidney:

Renal measurements: 11.6 x 5.1 x 5.6 cm = volume: 735 mL.
Echogenicity within normal limits. No mass or hydronephrosis
visualized.

Left Kidney:

Renal measurements: 11.4 x 5.1 x 5.1 cm = volume: 156 mL.
Echogenicity within normal limits. No mass or hydronephrosis
visualized.

Bladder:

Appears normal for degree of bladder distention. Bilateral jets
visualized.

Other:

None.
IMPRESSION: No hydronephrosis or nephrolithiasis.

## 2023-03-04 ENCOUNTER — Other Ambulatory Visit: Payer: Self-pay | Admitting: Family

## 2023-03-04 ENCOUNTER — Ambulatory Visit
Admission: RE | Admit: 2023-03-04 | Discharge: 2023-03-04 | Disposition: A | Discharge status: Home / Self Care | Payer: BC Managed Care – PPO | Source: Ambulatory Visit | Attending: Family | Admitting: Family

## 2023-03-04 DIAGNOSIS — Z1231 Encounter for screening mammogram for malignant neoplasm of breast: Secondary | ICD-10-CM

## 2024-03-27 ENCOUNTER — Other Ambulatory Visit: Payer: Self-pay | Admitting: Family

## 2024-03-27 DIAGNOSIS — Z1231 Encounter for screening mammogram for malignant neoplasm of breast: Secondary | ICD-10-CM

## 2024-04-10 ENCOUNTER — Encounter
